# Patient Record
Sex: Female | Born: 1970 | Race: White | Hispanic: No | Marital: Married | Smoking: Never smoker
Health system: Southern US, Community
[De-identification: ages and names within clinical notes are randomized; demographics above are authoritative.]

## PROBLEM LIST (undated history)

## (undated) DIAGNOSIS — I10 Essential (primary) hypertension: Secondary | ICD-10-CM

## (undated) DIAGNOSIS — Z923 Personal history of irradiation: Secondary | ICD-10-CM

---

## 2021-11-18 ENCOUNTER — Ambulatory Visit: Payer: Self-pay | Admitting: General Surgery

## 2021-11-18 DIAGNOSIS — Z170 Estrogen receptor positive status [ER+]: Secondary | ICD-10-CM

## 2021-11-28 ENCOUNTER — Other Ambulatory Visit: Payer: Self-pay | Admitting: General Surgery

## 2021-11-28 DIAGNOSIS — Z170 Estrogen receptor positive status [ER+]: Secondary | ICD-10-CM

## 2021-12-06 ENCOUNTER — Ambulatory Visit
Admission: RE | Admit: 2021-12-06 | Discharge: 2021-12-06 | Disposition: A | Discharge status: Home / Self Care | Payer: BC Managed Care – PPO | Source: Ambulatory Visit | Attending: General Surgery | Admitting: General Surgery

## 2021-12-06 DIAGNOSIS — Z170 Estrogen receptor positive status [ER+]: Secondary | ICD-10-CM

## 2021-12-06 DIAGNOSIS — C50311 Malignant neoplasm of lower-inner quadrant of right female breast: Secondary | ICD-10-CM

## 2021-12-06 MED ORDER — GADOBUTROL 1 MMOL/ML IV SOLN
9.0000 mL | Freq: Once | INTRAVENOUS | Status: AC | PRN
  Administered 2021-12-06: 9 mL via INTRAVENOUS

## 2021-12-18 ENCOUNTER — Telehealth: Payer: Self-pay | Admitting: General Surgery

## 2021-12-18 NOTE — Telephone Encounter (Signed)
I discussed the breast MR findings with the patient and her husband.  The actual mass from the cancer appears to be a similar size to the mammogram, but there is concerning non mass enhancement going from the mass toward the base of the nipple.  I discussed that if this area is all cancer, she will need a mastectomy given that there would not be adequate breast tissue left behind to have any reasonable cosmetic outcome.  We discussed pursuing mastectomy +/- reconstruction vs additional MRI guided biopsy prior to making the decision.    Will order MR guided biopsy of the non mass enhancement area between the nipple and the mass.  I discussed that it is more likely than not to be malignant.  I will also set up appt after the biopsy to discuss finding.

## 2021-12-19 ENCOUNTER — Other Ambulatory Visit: Payer: Self-pay | Admitting: General Surgery

## 2021-12-19 DIAGNOSIS — R928 Other abnormal and inconclusive findings on diagnostic imaging of breast: Secondary | ICD-10-CM

## 2021-12-27 ENCOUNTER — Ambulatory Visit
Admission: RE | Admit: 2021-12-27 | Discharge: 2021-12-27 | Disposition: A | Discharge status: Home / Self Care | Payer: BC Managed Care – PPO | Source: Ambulatory Visit | Attending: General Surgery | Admitting: General Surgery

## 2021-12-27 DIAGNOSIS — R928 Other abnormal and inconclusive findings on diagnostic imaging of breast: Secondary | ICD-10-CM

## 2021-12-30 ENCOUNTER — Other Ambulatory Visit: Payer: Self-pay | Admitting: General Surgery

## 2021-12-30 DIAGNOSIS — Z170 Estrogen receptor positive status [ER+]: Secondary | ICD-10-CM

## 2022-01-03 ENCOUNTER — Other Ambulatory Visit: Payer: Self-pay | Admitting: General Surgery

## 2022-01-03 DIAGNOSIS — Z170 Estrogen receptor positive status [ER+]: Secondary | ICD-10-CM

## 2022-01-07 ENCOUNTER — Telehealth: Payer: Self-pay | Admitting: Hematology and Oncology

## 2022-01-07 NOTE — Telephone Encounter (Signed)
Scheduled appt per 7/7 referral. Pt is aware of appt date and time. Pt is aware to arrive 15 mins prior to appt time and to bring and updated insurance card. Pt is aware of appt location.

## 2022-01-12 LAB — COLOGUARD: COLOGUARD: NEGATIVE

## 2022-01-16 ENCOUNTER — Encounter (HOSPITAL_COMMUNITY): Payer: Self-pay

## 2022-01-16 NOTE — Pre-Procedure Instructions (Signed)
Surgical Instructions    Your procedure is scheduled on Tuesday, July 25th.  Report to [facility] Main Entrance "A" at 05:30 A.M., then check in with the Admitting office.  Call this number if you have problems the morning of surgery:  [phone]   If you have any questions prior to your surgery date call [phone]: Open Monday-Friday 8am-4pm    Remember:  Do not eat after midnight the night before your surgery  You may drink clear liquids until 04:30 AM the morning of your surgery.   Clear liquids allowed are: Water, Non-Citrus Juices (without pulp), Carbonated Beverages, Clear Tea, Black Coffee Only (NO MILK, CREAM OR POWDERED CREAMER of any kind), and Gatorade.   Patient Instructions  The night before surgery:  No food after midnight. ONLY clear liquids after midnight  The day of surgery (if you do NOT have diabetes):  Drink ONE (1) Pre-Surgery Clear Ensure by 04:30 AM the morning of surgery. Drink in one sitting. Do not sip.  This drink was given to you during your hospital  pre-op appointment visit.  Nothing else to drink after completing the  Pre-Surgery Clear Ensure.          If you have questions, please contact your surgeon's office.      Take these medicines the morning of surgery with A SIP OF WATER: NONE   As of today, STOP taking any Aspirin (unless otherwise instructed by your surgeon) Aleve, Naproxen, Ibuprofen, Motrin, Advil, Goody's, BC's, all herbal medications, fish oil, and all vitamins.                     Do NOT Smoke (Tobacco/Vaping) for 24 hours prior to your procedure.  If you use a CPAP at night, you may bring your mask/headgear for your overnight stay.   Contacts, glasses, piercing's, hearing aid's, dentures or partials may not be worn into surgery, please bring cases for these belongings.    For patients admitted to the hospital, discharge time will be determined by your treatment team.   Patients discharged the day of surgery will not  be allowed to drive home, and someone needs to stay with them for 24 hours.  SURGICAL WAITING ROOM VISITATION Patients having surgery or a procedure may have no more than 2 support people in the waiting area - these visitors may rotate.   Children under the age of 12 must have an adult with them who is not the patient. If the patient needs to stay at the hospital during part of their recovery, the visitor guidelines for inpatient rooms apply. Pre-op nurse will coordinate an appropriate time for 1 support person to accompany patient in pre-op.  This support person may not rotate.   Please refer to the [facility] website for the visitor guidelines for Inpatients (after your surgery is over and you are in a regular room).    Special instructions:   [place]- Preparing For Surgery  Before surgery, you can play an important role. Because skin is not sterile, your skin needs to be as free of germs as possible. You can reduce the number of germs on your skin by washing with CHG (chlorahexidine gluconate) Soap before surgery.  CHG is an antiseptic cleaner which kills germs and bonds with the skin to continue killing germs even after washing.    Oral Hygiene is also important to reduce your risk of infection.  Remember - BRUSH YOUR TEETH THE MORNING OF SURGERY WITH YOUR REGULAR TOOTHPASTE  Please do not use if you have an allergy to CHG or antibacterial soaps. If your skin becomes reddened/irritated stop using the CHG.  Do not shave (including legs and underarms) for at least 48 hours prior to first CHG shower. It is OK to shave your face.  Please follow these instructions carefully.   Shower the NIGHT BEFORE SURGERY and the MORNING OF SURGERY  If you chose to wash your hair, wash your hair first as usual with your normal shampoo.  After you shampoo, rinse your hair and body thoroughly to remove the shampoo.  Use CHG Soap as you would any other liquid soap. You can apply CHG directly to the  skin and wash gently with a scrungie or a clean washcloth.   Apply the CHG Soap to your body ONLY FROM THE NECK DOWN.  Do not use on open wounds or open sores. Avoid contact with your eyes, ears, mouth and genitals (private parts). Wash Face and genitals (private parts)  with your normal soap.   Wash thoroughly, paying special attention to the area where your surgery will be performed.  Thoroughly rinse your body with warm water from the neck down.  DO NOT shower/wash with your normal soap after using and rinsing off the CHG Soap.  Pat yourself dry with a CLEAN TOWEL.  Wear CLEAN PAJAMAS to bed the night before surgery  Place CLEAN SHEETS on your bed the night before your surgery  DO NOT SLEEP WITH PETS.   Day of Surgery: Take a shower with CHG soap. Do not wear jewelry or makeup Do not wear lotions, powders, perfumes, or deodorant. Do not shave 48 hours prior to surgery.   Do not bring valuables to the hospital. [facility] is not responsible for any belongings or valuables. Do not wear nail polish, gel polish, artificial nails, or any other type of covering on natural nails (fingers and toes) If you have artificial nails or gel coating that need to be removed by a nail salon, please have this removed prior to surgery. Artificial nails or gel coating may interfere with anesthesia's ability to adequately monitor your vital signs. Wear Clean/Comfortable clothing the morning of surgery Remember to brush your teeth WITH YOUR REGULAR TOOTHPASTE.   Please read over the following fact sheets that you were given.    If you received a COVID test during your pre-op visit  it is requested that you wear a mask when out in public, stay away from anyone that may not be feeling well and notify your surgeon if you develop symptoms. If you have been in contact with anyone that has tested positive in the last 10 days please notify you surgeon.

## 2022-01-17 ENCOUNTER — Other Ambulatory Visit: Payer: Self-pay

## 2022-01-17 ENCOUNTER — Encounter (HOSPITAL_COMMUNITY)
Admission: RE | Admit: 2022-01-17 | Discharge: 2022-01-17 | Disposition: A | Discharge status: Home / Self Care | Payer: BC Managed Care – PPO | Source: Ambulatory Visit | Attending: General Surgery | Admitting: General Surgery

## 2022-01-17 ENCOUNTER — Encounter (HOSPITAL_COMMUNITY): Payer: Self-pay | Admitting: *Deleted

## 2022-01-17 VITALS — BP 131/79 | HR 101 | Temp 98.6°F | Resp 17 | Ht 66.0 in | Wt 208.0 lb

## 2022-01-17 DIAGNOSIS — Z01818 Encounter for other preprocedural examination: Secondary | ICD-10-CM | POA: Diagnosis present

## 2022-01-17 DIAGNOSIS — I10 Essential (primary) hypertension: Secondary | ICD-10-CM

## 2022-01-17 DIAGNOSIS — C50311 Malignant neoplasm of lower-inner quadrant of right female breast: Secondary | ICD-10-CM

## 2022-01-17 DIAGNOSIS — Z170 Estrogen receptor positive status [ER+]: Secondary | ICD-10-CM | POA: Diagnosis not present

## 2022-01-17 HISTORY — DX: Essential (primary) hypertension: I10

## 2022-01-17 LAB — COMPREHENSIVE METABOLIC PANEL
ALT: 42 U/L (ref 0–44)
AST: 27 U/L (ref 15–41)
Albumin: 3.8 g/dL (ref 3.5–5.0)
Alkaline Phosphatase: 58 U/L (ref 38–126)
Anion gap: 8 (ref 5–15)
BUN: 13 mg/dL (ref 6–20)
CO2: 26 mmol/L (ref 22–32)
Calcium: 9.4 mg/dL (ref 8.9–10.3)
Chloride: 106 mmol/L (ref 98–111)
Creatinine, Ser: 0.82 mg/dL (ref 0.44–1.00)
GFR, Estimated: >60 mL/min (ref >60)
Glucose, Bld: 124 mg/dL — ABNORMAL HIGH (ref 70–99)
Potassium: 3.8 mmol/L (ref 3.5–5.1)
Sodium: 140 mmol/L (ref 135–145)
Total Bilirubin: 1.1 mg/dL (ref 0.3–1.2)
Total Protein: 7.2 g/dL (ref 6.5–8.1)

## 2022-01-17 LAB — CBC WITH DIFFERENTIAL/PLATELET
Abs Immature Granulocytes: 0.02 10*3/uL (ref 0.00–0.07)
Basophils Absolute: 0.1 10*3/uL (ref 0.0–0.1)
Basophils Relative: 1 %
Eosinophils Absolute: 0.1 10*3/uL (ref 0.0–0.5)
Eosinophils Relative: 2 %
HCT: 44.8 % (ref 36.0–46.0)
Hemoglobin: 15.3 g/dL — ABNORMAL HIGH (ref 12.0–15.0)
Immature Granulocytes: 0 %
Lymphocytes Relative: 22 %
Lymphs Abs: 1.9 10*3/uL (ref 0.7–4.0)
MCH: 30.3 pg (ref 26.0–34.0)
MCHC: 34.2 g/dL (ref 30.0–36.0)
MCV: 88.7 fL (ref 80.0–100.0)
Monocytes Absolute: 0.5 10*3/uL (ref 0.1–1.0)
Monocytes Relative: 6 %
Neutro Abs: 6 10*3/uL (ref 1.7–7.7)
Neutrophils Relative %: 69 %
Platelets: 330 10*3/uL (ref 150–400)
RBC: 5.05 MIL/uL (ref 3.87–5.11)
RDW: 12.4 % (ref 11.5–15.5)
WBC: 8.6 10*3/uL (ref 4.0–10.5)
nRBC: 0 % (ref 0.0–0.2)

## 2022-01-17 NOTE — Progress Notes (Signed)
Location of Breast Cancer:  Malignant neoplasm of lower-inner quadrant of right breast of female, estrogen receptor positive  Histology per Pathology Report:  (Definitive pathology pending upcoming surgery) 11/05/2021   Receptor Status: ER(90%), PR (90%), Her2-neu (Negative), Ki-67(10%)  Did patient present with symptoms (if so, please note symptoms) or was this found on screening mammography?: (Dr. Marlowe Aschoff office note) Patient "had a screening detected right breast mass. Diagnostic imaging was then performed. That demonstrated a mass at 530 on the right breast. By ultrasound this appeared to be 1.9 cm in greatest dimension and on diagnostic mammogram it was closer to 2.6 cm"  Past/Anticipated interventions by surgeon, if any:  01/21/2022 --Dr. Stark Klein Scheduled for: RIGHT BREAST SEED LOCALIZED LUMPECTOMY AND SENTINEL LYMPH NODE BIOPSY  12/30/2021 --Dr. Stark Klein (office visit) Patient had a concordant biopsy for the non-mass enhancement going from the mass toward the nipple.  Because of this, it appears that her only cancer is the index mass.  This is amenable to a lumpectomy.  I will plan to do a seed localized lumpectomy and sentinel lymph node biopsy. Because of the recent move, she and her husband would like to come to [facility] for radiation oncology and medical oncology.  She plans to stay with her sister in [place] after surgery for a week or so. The surgical procedure was described to the patient.  I discussed the incision type and location and that we would need radiology involved on with a wire or seed marker and/or sentinel node.  The risks and benefits of the procedure were described to the patient and she wishes to proceed.    Past/Anticipated interventions by medical oncology, if any:  Scheduled for consultation with Dr. Benay Pike on 01/27/2022  Lymphedema issues, if any:  ***    Pain issues, if any:  ***   SAFETY ISSUES: Prior radiation?  *** Pacemaker/ICD? *** Possible current pregnancy?***No--postmenopausal  Is the patient on methotrexate? ***  Current Complaints / other details:  ***

## 2022-01-17 NOTE — Progress Notes (Signed)
PCP - Francis Gaines, FNP in [place] - Denies  PPM/ICD - Denies Device Orders -  Rep Notified -   Chest x-ray - NI EKG - 01/17/22 Stress Test - Denies ECHO - Denies Cardiac Cath -Denies  Sleep Study - Denies  DM - Denies   Blood Thinner Instructions:Denies Aspirin Instructions:Denies  ERAS Protcol -Yes PRE-SURGERY Ensure given   COVID TEST- NI   Anesthesia review: No  Patient denies shortness of breath, fever, cough and chest pain at PAT appointment   All instructions explained to the patient, with a verbal understanding of the material. Patient agrees to go over the instructions while at home for a better understanding.  The opportunity to ask questions was provided.

## 2022-01-19 NOTE — Progress Notes (Signed)
Radiation Oncology         [phone] ________________________________  Initial Outpatient Consultation  Name: Barbara [name] MRN: [id number]  Date: 01/20/2022  DOB: [birth year]  AC:ZYSAYTKZ, Toni Arthurs, FNP  Stark Klein, MD   REFERRING PHYSICIAN: Stark Klein, MD  DIAGNOSIS:    ICD-10-CM   1. Malignant neoplasm of lower-inner quadrant of right breast of female, estrogen receptor positive (Barbara [name])  C50.311    Z17.0       Right Breast LIQ Invasive Lobular Carcinoma, ER+ / PR+ / Her2-, Grade 2   Cancer Staging  Breast cancer of lower-inner quadrant of right female breast [facility]) Staging form: Breast, AJCC 8th Edition - Clinical stage from 01/20/2022: Stage IB (cT2, cN0, cM0, G2, ER+, PR+, HER2-) - Signed by Eppie Gibson, MD on 01/20/2022   CHIEF COMPLAINT: Here to discuss management of right breast cancer  HISTORY OF PRESENT ILLNESS::Barbara [name] is a 51 y.o. female who presented with a right breast abnormality on the following imaging: bilateral screening mammogram on the date of 10/30/21.  No symptoms, if any, were reported at that time.   Ultrasound of the right breast on 10/31/21 further revealed an irregular, hypoechoic, solid mass in the 5:30 o'clock position of the right breast, measuring 1.9 cm. No abnormal lymph nodes were appreciated in the right axilla.     Biopsy of the right breast mass on date of 11/05/21 showed grade 2 invasive lobular carcinoma.  ER status: 90% positive and PR status 90% positive, both with strong staining intensity; Her2 status negative; Proliferation marker Ki67 at 10%. No lymph nodes were examined.   Accordingly, the patient was referred to Dr. Barry Dienes on 11/18/21 to discuss surgical treatment options. Following discussion of the risks and benefits, the patient agreed to proceed with breast conserving surgery and SLN biopsies. She is scheduled for surgery on 01/21/22.   Pertinent imaging thus far includes a bilateral breast MRI on 12/06/21 which showed the  biopsy-proven carcinoma within the lower right breast, measuring 2.6 cm in the greatest dimension, with associated tenting of the underlying pectoralis muscle, but no evidence of enhancement/extension into the underlying pectoralis muscle. MRI also showed linear contiguous non-mass enhancement extending from the biopsy-proven carcinoma to near the base of the right nipple, measuring an additional 3.1 cm extent. Associated retraction of the right nipple and lower anterior periareolar right breast were also appreciated. Involvement of the right nipple itself thus can not be entirely excluded. MRI otherwise showed no evidence of additional multifocal or multicentric disease within the right breast, malignancy within the left breast, or evidence of metastatic lymphadenopathy.  Biopsy of the non-mass enhancement seen on MRI (going from the right breast mass towards the nipple) on 12/27/21 showed no evidence of malignancy, with usual ductal hyperplasia, fibrocystic changes, apocrine metaplasia, and calcifications.   The patient recently and temporarily moved to this region in transition from [facility].  She ultimately has plans to move to [facility].  She wishes to receive all her oncologic care here.  She is not currently working.  She is here with her husband who appears very supportive.  She denies prior radiation.  She denies smoking.  Surgical breast conserving therapy planned for tomorrow.  She states that she is postmenopausal.  PREVIOUS RADIATION THERAPY: No  PAST MEDICAL HISTORY:  has a past medical history of Hypertension.    PAST SURGICAL HISTORY:History reviewed. No pertinent surgical history.  FAMILY HISTORY: family history includes Lung cancer in her father; Multiple myeloma in her mother.  SOCIAL HISTORY:  reports that she has never smoked. She has never used smokeless tobacco. She reports that she does not drink alcohol and does not use drugs.  ALLERGIES:  Patient has no known allergies.  MEDICATIONS:  Current Outpatient Medications  Medication Sig Dispense Refill   losartan-hydrochlorothiazide (HYZAAR) 100-12.5 MG tablet Take 1 tablet by mouth daily.     Omega-3 Fatty Acids (FISH OIL) 1000 MG CAPS Take 2 capsules by mouth daily.     No current facility-administered medications for this encounter.    REVIEW OF SYSTEMS: As above in HPI.   PHYSICAL EXAM:  height is _0  (1.676 m) and weight is 210 lb 9.6 oz (95.5 kg). Her blood pressure is 133/82 and her pulse is 102 (abnormal). Her respiration is 16 and oxygen saturation is 98%.   General: Alert and oriented, in no acute distress HEENT: Head is normocephalic. Extraocular movements are intact.   Heart: Regular in rate and rhythm with no murmurs, rubs, or gallops. Chest: Clear to auscultation bilaterally, with no rhonchi, wheezes, or rales. Extremities: No cyanosis or edema. Skin: No concerning lesions. Musculoskeletal: symmetric strength and muscle tone throughout. Neurologic: Cranial nerves II through XII are grossly intact. No obvious focalities. Speech is fluent. Coordination is intact. Psychiatric: Judgment and insight are intact. Affect is appropriate. Breasts: Lower inner quadrant of right breast demonstrates some retraction and diffuse firmness.  There is partial right nipple retraction as well.. No other palpable masses appreciated in the breasts or axillae bilaterally.    ECOG = 0  0 - Asymptomatic (Fully active, able to carry on all predisease activities without restriction)  1 - Symptomatic but completely ambulatory (Restricted in physically strenuous activity but ambulatory and able to carry out work of a light or sedentary nature. For example, light housework, office work)  2 - Symptomatic, <50% in bed during the day (Ambulatory and capable of all self care but unable to carry out any work activities. Up and about more than 50% of waking hours)  3 - Symptomatic, >50% in  bed, but not bedbound (Capable of only limited self-care, confined to bed or chair 50% or more of waking hours)  4 - Bedbound (Completely disabled. Cannot carry on any self-care. Totally confined to bed or chair)  5 - Death   Eustace Pen MM, Creech RH, Tormey DC, et al. [phone]). "Toxicity and response criteria of the [facility]". [place] Oncol. 5 (6): 649-55   LABORATORY DATA:  Lab Results  Component Value Date   WBC 8.6 01/17/2022   HGB 15.3 (H) 01/17/2022   HCT 44.8 01/17/2022   MCV 88.7 01/17/2022   PLT 330 01/17/2022   CMP     Component Value Date/Time   NA 140 01/17/2022 0850   K 3.8 01/17/2022 0850   CL 106 01/17/2022 0850   CO2 26 01/17/2022 0850   GLUCOSE 124 (H) 01/17/2022 0850   BUN 13 01/17/2022 0850   CREATININE 0.82 01/17/2022 0850   CALCIUM 9.4 01/17/2022 0850   PROT 7.2 01/17/2022 0850   ALBUMIN 3.8 01/17/2022 0850   AST 27 01/17/2022 0850   ALT 42 01/17/2022 0850   ALKPHOS 58 01/17/2022 0850   BILITOT 1.1 01/17/2022 0850   GFRNONAA >60 01/17/2022 0850         RADIOGRAPHY: MR RT BREAST BX W LOC DEV 1ST LESION IMAGE BX SPEC MR GUIDE  Addendum Date: 01/01/2022   ADDENDUM REPORT: 01/01/2022 12:01 ADDENDUM: Pathology revealed Breast, RIGHT, needle core biopsy- (  dumbbell clip)- USUAL DUCTAL HYPERPLASIA, FIBROCYSTIC CHANGES WITH APOCRINE METAPLASIA AND CALCIFICATIONS- LOBULAR NEOPLASIA (ATYPICAL LOBULAR HYPERPLASIA)- NO MALIGNANCY IDENTIFIED. This was found to be concordant by Dr. Lovey Newcomer. This should be excised the time of lumpectomy. Pathology results were discussed with the patient by telephone. The patient reported doing well after the biopsy with tenderness at the site. Post biopsy instructions and care were reviewed and questions were answered. The patient was encouraged to call [facility] for any additional concerns. The patient has a recent diagnosis of right breast cancer and should follow her outlined  treatment plan. The findings were discussed with Dr. Barry Dienes. She will perform a wide excision along the anterior margin of the known cancer towards the nipple to include the linear enhancement on prior MRI. Pathology results reported by Stacie Acres RN on 01/01/2022. Electronically Signed   By: Lovey Newcomer M.D.   On: 01/01/2022 12:01   Result Date: 01/01/2022 CLINICAL DATA:  MRI guided biopsy of linear enhancement extending anterior to the known malignancy. EXAM: MRI GUIDED CORE NEEDLE BIOPSY OF THE RIGHT BREAST TECHNIQUE: Multiplanar, multisequence MR imaging of the right breast was performed both before and after administration of intravenous contrast. CONTRAST:  9 mL of Gadavist COMPARISON:  None Available. FINDINGS: I met with the patient, and we discussed the procedure of MRI guided biopsy, including risks, benefits, and alternatives. Specifically, we discussed the risks of infection, bleeding, tissue injury, clip migration, and inadequate sampling. Informed, written consent was given. The usual time out protocol was performed immediately prior to the procedure. Using sterile technique, 1% Lidocaine, MRI guidance, and a 9 gauge vacuum assisted device, biopsy was performed of the linear enhancement extending anterior to the known malignancy using a medial approach. At the conclusion of the procedure, a tissue marker clip was deployed into the biopsy cavity. Follow-up 2-view mammogram was performed and dictated separately. IMPRESSION: MRI guided biopsy of the linear enhancement extending anterior to the known malignancy. No apparent complications. Electronically Signed: By: Dorise Bullion III M.D. On: 12/27/2021 09:51  MM CLIP PLACEMENT RIGHT  Result Date: 12/27/2021 CLINICAL DATA:  Evaluate biopsy marker after MRI guided biopsy EXAM: 3D DIAGNOSTIC RIGHT MAMMOGRAM POST MRI BIOPSY COMPARISON:  Previous exam(s). FINDINGS: 3D Mammographic images were obtained following MRI guided biopsy of non mass enhancement in  the right breast. The biopsy marking clip is in expected position at the site of biopsy. IMPRESSION: Appropriate positioning of the dumbbell shaped biopsy marking clip at the site of biopsy in the location of the biopsied non mass enhancement. Final Assessment: Post Procedure Mammograms for Marker Placement Electronically Signed   By: Dorise Bullion III M.D.   On: 12/27/2021 10:23     IMPRESSION/PLAN: Right breast cancer, ER positive, with plans for lumpectomy and sentinel lymph node biopsy  It was a pleasure meeting the patient today. We discussed the risks, benefits, and side effects of radiotherapy. I recommend radiotherapy to the right breast to reduce her risk of locoregional recurrence by 2/3.  We discussed that radiation would take approximately 4-6 weeks to complete and that I would give the patient a few weeks to heal following surgery before starting treatment planning.  If chemotherapy were to be given, this would precede radiotherapy. We spoke about acute effects including skin irritation and fatigue as well as much less common late effects including internal organ injury or irritation. We spoke about the latest technology that is used to minimize the risk of late effects for  patients undergoing radiotherapy to the breast or chest wall. No guarantees of treatment were given. The patient is enthusiastic about proceeding with treatment. I look forward to participating in the patient's care.  I will await her referral back to me for postoperative follow-up and eventual CT simulation/treatment planning.  On date of service, in total, I spent 40 minutes on this encounter. Patient was seen in person.   __________________________________________   Eppie Gibson, MD  This document serves as a record of services personally performed by Eppie Gibson, MD. It was created on her behalf by Roney Mans, a trained medical scribe. The creation of this record is based on the scribe's personal observations  and the provider's statements to them. This document has been checked and approved by the attending provider.

## 2022-01-20 ENCOUNTER — Encounter: Payer: Self-pay | Admitting: Radiation Oncology

## 2022-01-20 ENCOUNTER — Ambulatory Visit
Admission: RE | Admit: 2022-01-20 | Discharge: 2022-01-20 | Disposition: A | Discharge status: Home / Self Care | Payer: BC Managed Care – PPO | Source: Ambulatory Visit | Attending: Radiation Oncology | Admitting: Radiation Oncology

## 2022-01-20 ENCOUNTER — Ambulatory Visit
Admission: RE | Admit: 2022-01-20 | Discharge: 2022-01-20 | Disposition: A | Discharge status: Home / Self Care | Payer: BC Managed Care – PPO | Source: Ambulatory Visit | Attending: General Surgery | Admitting: General Surgery

## 2022-01-20 ENCOUNTER — Other Ambulatory Visit: Payer: Self-pay

## 2022-01-20 DIAGNOSIS — C50311 Malignant neoplasm of lower-inner quadrant of right female breast: Secondary | ICD-10-CM

## 2022-01-20 NOTE — Anesthesia Preprocedure Evaluation (Addendum)
Anesthesia Evaluation  Patient identified by MRN, date of birth, ID band Patient awake    Reviewed: Allergy & Precautions, NPO status , Patient's Chart, lab work & pertinent test results  History of Anesthesia Complications Negative for: history of anesthetic complications  Airway Mallampati: II  TM Distance: >3 FB Neck ROM: Full    Dental  (+) Dental Advisory Given   Pulmonary neg pulmonary ROS,    breath sounds clear to auscultation       Cardiovascular hypertension, Pt. on medications (-) angina Rhythm:Regular Rate:Normal     Neuro/Psych negative neurological ROS     GI/Hepatic negative GI ROS, Neg liver ROS,   Endo/Other  obese  Renal/GU negative Renal ROS     Musculoskeletal   Abdominal (+) + obese,   Peds  Hematology negative hematology ROS (+)   Anesthesia Other Findings   Reproductive/Obstetrics                            Anesthesia Physical Anesthesia Plan  ASA: 2  Anesthesia Plan: General   Post-op Pain Management: Regional block* and Tylenol PO (pre-op)*   Induction: Intravenous  PONV Risk Score and Plan: 3 and Ondansetron, Dexamethasone and Scopolamine patch - Pre-op  Airway Management Planned: LMA  Additional Equipment: None  Intra-op Plan:   Post-operative Plan:   Informed Consent: I have reviewed the patients History and Physical, chart, labs and discussed the procedure including the risks, benefits and alternatives for the proposed anesthesia with the patient or authorized representative who has indicated his/her understanding and acceptance.     Dental advisory given  Plan Discussed with: CRNA and Surgeon  Anesthesia Plan Comments: (Plan routine monitors, GA with pectoralis block for post op analgesia)       Anesthesia Quick Evaluation

## 2022-01-20 NOTE — H&P (Signed)
PROVIDER:  Georgianne Fick, MD   MRN: [id number] DOB: 07/29/1970 DATE OF ENCOUNTER: 12/30/2021 Subjective     Chief Complaint: Follow-up (Breast mri results)       History of Present Illness: Barbara [name] is a 51 y.o. female who is seen today for breast cancer follow up Initial history:    Patient presented with a new right breast cancer May 2023.  She had a screening detected right breast mass. Diagnostic imaging was then performed.  That demonstrated a mass at 530 on the right breast.  By ultrasound this appeared to be 1.9 cm in greatest dimension and on diagnostic mammogram it was closer to 2.6 cm.  A core needle biopsy was performed showing intermediate grade invasive lobular carcinoma.  This was ER and PR strongly positive at 90% for both, HER2 negative.  Proliferative rate was 10%.   She has no family history of adenocarcinomas of which she is aware.  Her mother did have multiple myeloma and she has some relatives who have had lung cancer.  She has not ever had surgery before.  She typically works in the [employer] but is currently taking time between jobs with this new diagnosis of cancer.   Interval history:  Pt underwent breast MRI and a subsequent MR guided biopsy.  The MR showed enhancement anterior to the mass going all the way to the nipple.  The path on the MR guided biopsy was benign and concordant.     She moved to [place] since I saw her last.     MR bilat 12/06/2021   IMPRESSION: 1. Biopsy-proven carcinoma within the lower RIGHT breast, labeled 5:30 o'clock axis on outside ultrasound, measuring 2.6 cm greatest dimension. Associated tenting of the underlying pectoralis muscle, but no evidence of enhancement/extension into the underlying pectoralis muscle. 2. Linear contiguous non-mass enhancement extending from the biopsy-proven carcinoma to near the base of the RIGHT nipple, measuring an additional 3.1 cm extent, increasing the overall AP measurement to  5.3 cm, with associated retraction of the RIGHT nipple and lower anterior periareolar RIGHT breast. Can not exclude involvement of the RIGHT nipple itself. If breast conservation surgery, recommend adequate excision from the mass to the RIGHT nipple. 3. No evidence of additional multifocal or multicentric disease within the RIGHT breast. 4. No evidence of contralateral malignancy within the LEFT breast. 5. No evidence of metastatic lymphadenopathy.   RECOMMENDATION: 1. If breast conservation surgery for the RIGHT breast, recommend adequate excision from the biopsy-proven carcinoma in the lower RIGHT breast to the RIGHT nipple (as detailed above). 2. Otherwise, per treatment plan for patient's biopsy-proven RIGHT breast cancer.   BI-RADS CATEGORY  6: Known biopsy-proven malignancy.   Pathology 12/27/2021 Breast, right, needle core biopsy - USUAL DUCTAL HYPERPLASIA, FIBROCYSTIC CHANGES WITH APOCRINE METAPLASIA AND CALCIFICATIONS - LOBULAR NEOPLASIA (ATYPICAL LOBULAR HYPERPLASIA) - NO MALIGNANCY IDENTIFIED   Review of Systems: A complete review of systems was obtained from the patient.  I have reviewed this information and discussed as appropriate with the patient.  See HPI as well for other ROS.   Review of Systems  All other systems reviewed and are negative.      Medical History: Past Medical History      Past Medical History:  Diagnosis Date   Hypertension             Patient Active Problem List  Diagnosis   Malignant neoplasm of lower-inner quadrant of right breast of female, estrogen receptor positive (CMS-HCC)  Past Surgical History  History reviewed. No pertinent surgical history.      Allergies  No Known Allergies           Current Outpatient Medications on File Prior to Visit  Medication Sig Dispense Refill   FISH OIL 1,000 mg (120 mg-180 mg) Cap TAKE 2 CAPSULES BY MOUTH ONCE DAILY       losartan-hydroCHLOROthiazide (HYZAAR) 100-12.5 mg tablet Take  1 tablet by mouth once daily        No current facility-administered medications on file prior to visit.      Family History       Family History  Problem Relation Age of Onset   Hyperlipidemia (Elevated cholesterol) Mother     High blood pressure (Hypertension) Mother          Social History       Tobacco Use  Smoking Status Never  Smokeless Tobacco Never      Social History  Social History        Socioeconomic History   Marital status: Unknown  Tobacco Use   Smoking status: Never   Smokeless tobacco: Never  Substance and Sexual Activity   Alcohol use: Never   Drug use: Never        Objective:      There were no vitals filed for this visit.  There is no height or weight on file to calculate BMI.   Head:   Normocephalic and atraumatic.  Eyes:    Conjunctivae are normal. Pupils are equal, round, and reactive to light. No scleral icterus.  Neck:   Normal range of motion. Neck supple. No tracheal deviation present. No thyromegaly present.  Resp:   No respiratory distress, normal effort. Breast: no palpable mass.  No LAD. Faint bruising right breast Abd:      Abdomen is soft, non distended and non tender. No masses are palpable.  There is no rebound and no guarding.  Neurological: Alert and oriented to person, place, and time. Coordination normal.  Skin:    Skin is warm and dry. No rash noted. No diaphoretic. No erythema. No pallor.  Psychiatric: Normal mood and affect. Normal behavior. Judgment and thought content normal.      Labs, Imaging and Diagnostic Testing:   See above.     Assessment and Plan:     Diagnoses and all orders for this visit:   Malignant neoplasm of lower-inner quadrant of right breast of female, estrogen receptor positive (CMS-HCC) -     Ambulatory Referral to Radiation Oncology -     Ambulatory Referral to Oncology-Medical     Patient had a concordant biopsy for the non-mass enhancement going from the mass toward the nipple.   Because of this, it appears that her only cancer is the index mass.  This is amenable to a lumpectomy.  I will plan to do a seed localized lumpectomy and sentinel lymph node biopsy.   Because of the recent move, she and her husband would like to come to [facility] for radiation oncology and medical oncology.  She plans to stay with her sister in [place] after surgery for a week or so.   The surgical procedure was described to the patient.  I discussed the incision type and location and that we would need radiology involved on with a wire or seed marker and/or sentinel node.       The risks and benefits of the procedure were described to the patient and she wishes to proceed.  We discussed the risks bleeding, infection, damage to other structures, need for further procedures/surgeries.  We discussed the risk of seroma.  The patient was advised if the area in the breast in cancer, we may need to go back to surgery for additional tissue to obtain negative margins or for a lymph node biopsy. The patient was advised that these are the most common complications, but that others can occur as well.  They were advised against taking aspirin or other anti-inflammatory agents/blood thinners the week before surgery.       No follow-ups on file. Georgianne Fick, MD

## 2022-01-21 ENCOUNTER — Ambulatory Visit (HOSPITAL_COMMUNITY): Payer: BC Managed Care – PPO | Admitting: Anesthesiology

## 2022-01-21 ENCOUNTER — Ambulatory Visit (HOSPITAL_COMMUNITY)
Admission: RE | Admit: 2022-01-21 | Discharge: 2022-01-21 | Disposition: A | Discharge status: Home / Self Care | Payer: BC Managed Care – PPO | Attending: General Surgery | Admitting: General Surgery

## 2022-01-21 ENCOUNTER — Encounter (HOSPITAL_COMMUNITY): Payer: Self-pay | Admitting: General Surgery

## 2022-01-21 ENCOUNTER — Ambulatory Visit (HOSPITAL_COMMUNITY): Payer: BC Managed Care – PPO | Admitting: Emergency Medicine

## 2022-01-21 ENCOUNTER — Other Ambulatory Visit: Payer: Self-pay

## 2022-01-21 ENCOUNTER — Encounter (HOSPITAL_COMMUNITY)
Admission: RE | Disposition: A | Discharge status: Home / Self Care | Payer: Self-pay | Source: Home / Self Care | Attending: General Surgery

## 2022-01-21 ENCOUNTER — Ambulatory Visit
Admission: RE | Admit: 2022-01-21 | Discharge: 2022-01-21 | Disposition: A | Discharge status: Home / Self Care | Payer: BC Managed Care – PPO | Source: Ambulatory Visit | Attending: General Surgery | Admitting: General Surgery

## 2022-01-21 DIAGNOSIS — Z801 Family history of malignant neoplasm of trachea, bronchus and lung: Secondary | ICD-10-CM | POA: Insufficient documentation

## 2022-01-21 DIAGNOSIS — Z6833 Body mass index (BMI) 33.0-33.9, adult: Secondary | ICD-10-CM | POA: Diagnosis not present

## 2022-01-21 DIAGNOSIS — E669 Obesity, unspecified: Secondary | ICD-10-CM | POA: Diagnosis not present

## 2022-01-21 DIAGNOSIS — C50311 Malignant neoplasm of lower-inner quadrant of right female breast: Secondary | ICD-10-CM | POA: Diagnosis present

## 2022-01-21 DIAGNOSIS — I10 Essential (primary) hypertension: Secondary | ICD-10-CM | POA: Insufficient documentation

## 2022-01-21 DIAGNOSIS — Z807 Family history of other malignant neoplasms of lymphoid, hematopoietic and related tissues: Secondary | ICD-10-CM | POA: Diagnosis not present

## 2022-01-21 DIAGNOSIS — Z170 Estrogen receptor positive status [ER+]: Secondary | ICD-10-CM

## 2022-01-21 HISTORY — PX: BREAST LUMPECTOMY WITH RADIOACTIVE SEED AND SENTINEL LYMPH NODE BIOPSY: SHX6550

## 2022-01-21 SURGERY — BREAST LUMPECTOMY WITH RADIOACTIVE SEED AND SENTINEL LYMPH NODE BIOPSY
Anesthesia: General | Site: Breast | Laterality: Right

## 2022-01-21 MED ORDER — SCOPOLAMINE 1 MG/3DAYS TD PT72
MEDICATED_PATCH | TRANSDERMAL | Status: AC
  Administered 2022-01-21: 1.5 mg via TRANSDERMAL
  Filled 2022-01-21: qty 1

## 2022-01-21 MED ORDER — CHLORHEXIDINE GLUCONATE CLOTH 2 % EX PADS: 6.0000 | MEDICATED_PAD | Freq: Once | CUTANEOUS | Status: DC

## 2022-01-21 MED ORDER — ONDANSETRON HCL 4 MG/2ML IJ SOLN
INTRAMUSCULAR | Status: DC | PRN
  Administered 2022-01-21: 4 mg via INTRAVENOUS

## 2022-01-21 MED ORDER — PROPOFOL 10 MG/ML IV BOLUS
INTRAVENOUS | Status: AC
  Filled 2022-01-21: qty 20

## 2022-01-21 MED ORDER — MIDAZOLAM HCL 2 MG/2ML IJ SOLN
INTRAMUSCULAR | Status: AC
  Filled 2022-01-21: qty 2

## 2022-01-21 MED ORDER — PHENYLEPHRINE HCL-NACL 20-0.9 MG/250ML-% IV SOLN
INTRAVENOUS | Status: DC | PRN
  Administered 2022-01-21: 20 ug/min via INTRAVENOUS
  Administered 2022-01-21: 40 ug/min via INTRAVENOUS

## 2022-01-21 MED ORDER — LIDOCAINE 2% (20 MG/ML) 5 ML SYRINGE
INTRAMUSCULAR | Status: DC | PRN
  Administered 2022-01-21: 40 mg via INTRAVENOUS

## 2022-01-21 MED ORDER — SCOPOLAMINE 1 MG/3DAYS TD PT72: 1.0000 | MEDICATED_PATCH | TRANSDERMAL | Status: DC

## 2022-01-21 MED ORDER — ACETAMINOPHEN 500 MG PO TABS: 1000.0000 mg | ORAL_TABLET | ORAL | Status: AC

## 2022-01-21 MED ORDER — OXYCODONE HCL 5 MG/5ML PO SOLN: 5.0000 mg | Freq: Once | ORAL | Status: AC | PRN

## 2022-01-21 MED ORDER — AMISULPRIDE (ANTIEMETIC) 5 MG/2ML IV SOLN
5.0000 mg | Freq: Once | INTRAVENOUS | Status: AC
  Administered 2022-01-21: 5 mg via INTRAVENOUS

## 2022-01-21 MED ORDER — PROPOFOL 10 MG/ML IV BOLUS
INTRAVENOUS | Status: DC | PRN
  Administered 2022-01-21: 20 mg via INTRAVENOUS
  Administered 2022-01-21: 200 mg via INTRAVENOUS

## 2022-01-21 MED ORDER — CEFAZOLIN SODIUM-DEXTROSE 2-4 GM/100ML-% IV SOLN
INTRAVENOUS | Status: AC
  Filled 2022-01-21: qty 100

## 2022-01-21 MED ORDER — BUPIVACAINE-EPINEPHRINE (PF) 0.5% -1:200000 IJ SOLN
INTRAMUSCULAR | Status: DC | PRN
  Administered 2022-01-21: 30 mL

## 2022-01-21 MED ORDER — ACETAMINOPHEN 500 MG PO TABS
ORAL_TABLET | ORAL | Status: AC
  Administered 2022-01-21: 1000 mg via ORAL
  Filled 2022-01-21: qty 2

## 2022-01-21 MED ORDER — MIDAZOLAM HCL 2 MG/2ML IJ SOLN
INTRAMUSCULAR | Status: DC | PRN
  Administered 2022-01-21 (×2): 1 mg via INTRAVENOUS

## 2022-01-21 MED ORDER — AMISULPRIDE (ANTIEMETIC) 5 MG/2ML IV SOLN
INTRAVENOUS | Status: AC
  Filled 2022-01-21: qty 2

## 2022-01-21 MED ORDER — DEXAMETHASONE SODIUM PHOSPHATE 10 MG/ML IJ SOLN
INTRAMUSCULAR | Status: DC | PRN
  Administered 2022-01-21: 10 mg via INTRAVENOUS

## 2022-01-21 MED ORDER — 0.9 % SODIUM CHLORIDE (POUR BTL) OPTIME
TOPICAL | Status: DC | PRN
  Administered 2022-01-21: 1000 mL

## 2022-01-21 MED ORDER — MIDAZOLAM HCL 2 MG/2ML IJ SOLN: 0.5000 mg | Freq: Once | INTRAMUSCULAR | Status: DC | PRN

## 2022-01-21 MED ORDER — HYDROMORPHONE HCL 1 MG/ML IJ SOLN: 0.2500 mg | INTRAMUSCULAR | Status: DC | PRN

## 2022-01-21 MED ORDER — CEFAZOLIN SODIUM-DEXTROSE 2-4 GM/100ML-% IV SOLN
2.0000 g | INTRAVENOUS | Status: AC
  Administered 2022-01-21: 2 g via INTRAVENOUS

## 2022-01-21 MED ORDER — LACTATED RINGERS IV SOLN: INTRAVENOUS | Status: DC

## 2022-01-21 MED ORDER — FENTANYL CITRATE (PF) 250 MCG/5ML IJ SOLN
INTRAMUSCULAR | Status: DC | PRN
  Administered 2022-01-21 (×3): 25 ug via INTRAVENOUS
  Administered 2022-01-21: 50 ug via INTRAVENOUS
  Administered 2022-01-21 (×3): 25 ug via INTRAVENOUS

## 2022-01-21 MED ORDER — ENSURE PRE-SURGERY PO LIQD: 296.0000 mL | Freq: Once | ORAL | Status: DC

## 2022-01-21 MED ORDER — ACETAMINOPHEN 500 MG PO TABS: 1000.0000 mg | ORAL_TABLET | Freq: Once | ORAL | Status: AC

## 2022-01-21 MED ORDER — OXYCODONE HCL 5 MG PO TABS: 5.0000 mg | ORAL_TABLET | Freq: Four times a day (QID) | ORAL | 0 refills | Status: DC | PRN

## 2022-01-21 MED ORDER — MEPERIDINE HCL 25 MG/ML IJ SOLN: 6.2500 mg | INTRAMUSCULAR | Status: DC | PRN

## 2022-01-21 MED ORDER — CHLORHEXIDINE GLUCONATE 0.12 % MT SOLN: 15.0000 mL | Freq: Once | OROMUCOSAL | Status: AC

## 2022-01-21 MED ORDER — CHLORHEXIDINE GLUCONATE 0.12 % MT SOLN
OROMUCOSAL | Status: AC
  Administered 2022-01-21: 15 mL via OROMUCOSAL
  Filled 2022-01-21: qty 15

## 2022-01-21 MED ORDER — OXYCODONE HCL 5 MG PO TABS
ORAL_TABLET | ORAL | Status: AC
  Filled 2022-01-21: qty 1

## 2022-01-21 MED ORDER — LIDOCAINE HCL (PF) 1 % IJ SOLN
INTRAMUSCULAR | Status: AC
  Filled 2022-01-21: qty 30

## 2022-01-21 MED ORDER — FENTANYL CITRATE (PF) 250 MCG/5ML IJ SOLN
INTRAMUSCULAR | Status: AC
  Filled 2022-01-21: qty 5

## 2022-01-21 MED ORDER — ORAL CARE MOUTH RINSE: 15.0000 mL | Freq: Once | OROMUCOSAL | Status: AC

## 2022-01-21 MED ORDER — PROMETHAZINE HCL 25 MG/ML IJ SOLN: 6.2500 mg | INTRAMUSCULAR | Status: DC | PRN

## 2022-01-21 MED ORDER — MAGTRACE LYMPHATIC TRACER
INTRAMUSCULAR | Status: DC | PRN
  Administered 2022-01-21: 2 mL via INTRAMUSCULAR

## 2022-01-21 MED ORDER — OXYCODONE HCL 5 MG PO TABS
5.0000 mg | ORAL_TABLET | Freq: Once | ORAL | Status: AC | PRN
  Administered 2022-01-21: 5 mg via ORAL

## 2022-01-21 MED ORDER — BUPIVACAINE-EPINEPHRINE (PF) 0.25% -1:200000 IJ SOLN
INTRAMUSCULAR | Status: AC
  Filled 2022-01-21: qty 30

## 2022-01-21 SURGICAL SUPPLY — 46 items
BAG COUNTER SPONGE SURGICOUNT (BAG) ×2 IMPLANT
BINDER BREAST LRG (GAUZE/BANDAGES/DRESSINGS) IMPLANT
BINDER BREAST XLRG (GAUZE/BANDAGES/DRESSINGS) ×1 IMPLANT
BNDG COHESIVE 4X5 TAN STRL (GAUZE/BANDAGES/DRESSINGS) ×2 IMPLANT
CANISTER SUCT 3000ML PPV (MISCELLANEOUS) ×2 IMPLANT
CHLORAPREP W/TINT 26 (MISCELLANEOUS) ×2 IMPLANT
CLIP TI LARGE 6 (CLIP) ×2 IMPLANT
CLIP TI MEDIUM 24 (CLIP) ×2 IMPLANT
CNTNR URN SCR LID CUP LEK RST (MISCELLANEOUS) IMPLANT
CONT SPEC 4OZ STRL OR WHT (MISCELLANEOUS) ×2
COVER PROBE W GEL 5X96 (DRAPES) ×2 IMPLANT
COVER SURGICAL LIGHT HANDLE (MISCELLANEOUS) ×2 IMPLANT
DERMABOND ADVANCED (GAUZE/BANDAGES/DRESSINGS) ×1
DERMABOND ADVANCED .7 DNX12 (GAUZE/BANDAGES/DRESSINGS) ×1 IMPLANT
DEVICE DUBIN SPECIMEN MAMMOGRA (MISCELLANEOUS) ×1 IMPLANT
DRAPE CHEST BREAST 15X10 FENES (DRAPES) ×2 IMPLANT
ELECT COATED BLADE 2.86 ST (ELECTRODE) ×2 IMPLANT
ELECT REM PT RETURN 9FT ADLT (ELECTROSURGICAL) ×2
ELECTRODE REM PT RTRN 9FT ADLT (ELECTROSURGICAL) ×1 IMPLANT
GLOVE BIO SURGEON STRL SZ 6 (GLOVE) ×2 IMPLANT
GLOVE INDICATOR 6.5 STRL GRN (GLOVE) ×2 IMPLANT
GOWN STRL REUS W/ TWL LRG LVL3 (GOWN DISPOSABLE) ×1 IMPLANT
GOWN STRL REUS W/TWL 2XL LVL3 (GOWN DISPOSABLE) ×2 IMPLANT
GOWN STRL REUS W/TWL LRG LVL3 (GOWN DISPOSABLE) ×1
KIT BASIN OR (CUSTOM PROCEDURE TRAY) ×2 IMPLANT
KIT MARKER MARGIN INK (KITS) ×2 IMPLANT
LIGHT WAVEGUIDE WIDE FLAT (MISCELLANEOUS) ×1 IMPLANT
NDL 18GX1X1/2 (RX/OR ONLY) (NEEDLE) IMPLANT
NDL FILTER BLUNT 18X1 1/2 (NEEDLE) IMPLANT
NDL HYPO 25GX1X1/2 BEV (NEEDLE) ×1 IMPLANT
NEEDLE 18GX1X1/2 (RX/OR ONLY) (NEEDLE) ×2 IMPLANT
NEEDLE FILTER BLUNT 18X 1/2SAF (NEEDLE) ×1
NEEDLE FILTER BLUNT 18X1 1/2 (NEEDLE) ×1 IMPLANT
NEEDLE HYPO 25GX1X1/2 BEV (NEEDLE) ×2 IMPLANT
NS IRRIG 1000ML POUR BTL (IV SOLUTION) ×2 IMPLANT
PACK GENERAL/GYN (CUSTOM PROCEDURE TRAY) ×2 IMPLANT
PACK UNIVERSAL I (CUSTOM PROCEDURE TRAY) ×2 IMPLANT
STOCKINETTE IMPERVIOUS 9X36 MD (GAUZE/BANDAGES/DRESSINGS) ×2 IMPLANT
STRIP CLOSURE SKIN 1/2X4 (GAUZE/BANDAGES/DRESSINGS) ×1 IMPLANT
SUT MNCRL AB 4-0 PS2 18 (SUTURE) ×2 IMPLANT
SUT VIC AB 2-0 SH 27 (SUTURE) ×1
SUT VIC AB 2-0 SH 27XBRD (SUTURE) IMPLANT
SUT VIC AB 3-0 SH 8-18 (SUTURE) ×2 IMPLANT
SYR CONTROL 10ML LL (SYRINGE) ×2 IMPLANT
TOWEL GREEN STERILE (TOWEL DISPOSABLE) ×2 IMPLANT
TOWEL GREEN STERILE FF (TOWEL DISPOSABLE) ×2 IMPLANT

## 2022-01-21 NOTE — Interval H&P Note (Signed)
History and Physical Interval Note:  01/21/2022 7:29 AM  Barbara [name]  has presented today for surgery, with the diagnosis of RIGHT BREAST CANCER.  The various methods of treatment have been discussed with the patient and family. After consideration of risks, benefits and other options for treatment, the patient has consented to  Procedure(s) with comments: RIGHT BREAST SEED LOCALIZED LUMPECTOMY AND SENTINEL LYMPH NODE BIOPSY (Right) - GEN & PEC BLOCK as a surgical intervention.  The patient's history has been reviewed, patient examined, no change in status, stable for surgery.  I have reviewed the patient's chart and labs.  Questions were answered to the patient's satisfaction.     Stark Klein

## 2022-01-21 NOTE — Anesthesia Procedure Notes (Signed)
Anesthesia Regional Block: Pectoralis block   Pre-Anesthetic Checklist: , timeout performed,  Correct Patient, Correct Site, Correct Laterality,  Correct Procedure, Correct Position, site marked,  Risks and benefits discussed,  Surgical consent,  Pre-op evaluation,  At surgeon's request and post-op pain management  Laterality: Right  Prep: chloraprep       Needles:  Injection technique: Single-shot  Needle Type: Echogenic Needle     Needle Length: 9cm  Needle Gauge: 21     Additional Needles:   Procedures:,,,, ultrasound used (permanent image in chart),,    Narrative:  Start time: 01/21/2022 6:57 AM End time: 01/21/2022 7:03 AM Injection made incrementally with aspirations every 5 mL.  Performed by: Personally  Anesthesiologist: Annye Asa, MD  Additional Notes: Pt identified in Holding room.  Monitors applied. Working IV access confirmed. Sterile prep R clavicle and pec.  #21ga ECHOgenic Arrow block needle between pec minor, pec major, between ribs 4,5 with US guidance.  30cc 0.5% Bupivacaine 1:200k epi injected incrementally after negative test dose.  Patient asymptomatic, VSS, no heme aspirated, tolerated well.   Jenita Seashore, MD

## 2022-01-21 NOTE — Anesthesia Postprocedure Evaluation (Signed)
Anesthesia Post Note  Patient: Barbara [name]  Procedure(s) Performed: RIGHT BREAST SEED LOCALIZED LUMPECTOMY AND SENTINEL LYMPH NODE BIOPSY (Right: Breast)     Patient location during evaluation: PACU Anesthesia Type: General Level of consciousness: awake and alert, patient cooperative and oriented Pain management: pain level controlled Vital Signs Assessment: post-procedure vital signs reviewed and stable Respiratory status: spontaneous breathing, nonlabored ventilation and respiratory function stable Cardiovascular status: blood pressure returned to baseline and stable Postop Assessment: no apparent nausea or vomiting and adequate PO intake (nausea improving) Anesthetic complications: no   No notable events documented.  Last Vitals:  Vitals:   01/21/22 1110 01/21/22 1125  BP: (!) 153/86 (!) 150/87  Pulse: 84 78  Resp: 15 19  Temp:    SpO2: 97% 97%    Last Pain:  Vitals:   01/21/22 1110  TempSrc:   PainSc: 4                  Luster Hechler,E. Orvell Careaga

## 2022-01-21 NOTE — Discharge Instructions (Addendum)
[facility] Office Phone Number [phone]  BREAST BIOPSY/ PARTIAL MASTECTOMY: POST OP INSTRUCTIONS  Always review your discharge instruction sheet given to you by the facility where your surgery was performed.  IF YOU HAVE DISABILITY OR FAMILY LEAVE FORMS, YOU MUST BRING THEM TO THE OFFICE FOR PROCESSING.  DO NOT GIVE THEM TO YOUR DOCTOR.  Take 2 tylenol (acetominophen) three times a day for 3 days.  If you still have pain, add ibuprofen with food in between if able to take this (if you have kidney issues or stomach issues, do not take ibuprofen).  If both of those are not enough, add the narcotic pain pill.  If you find you are needing a lot of this overnight after surgery, call the next morning for a refill.    Prescriptions will not be filled after 5pm or on week-ends. Take your usually prescribed medications unless otherwise directed You should eat very light the first 24 hours after surgery, such as soup, crackers, pudding, etc.  Resume your normal diet the day after surgery. Most patients will experience some swelling and bruising in the breast.  Ice packs and a good support bra will help.  Swelling and bruising can take several days to resolve.  It is common to experience some constipation if taking pain medication after surgery.  Increasing fluid intake and taking a stool softener will usually help or prevent this problem from occurring.  A mild laxative (Milk of Magnesia or Miralax) should be taken according to package directions if there are no bowel movements after 48 hours. Unless discharge instructions indicate otherwise, you may remove your bandages 48 hours after surgery, and you may shower at that time.  You may have steri-strips (small skin tapes) in place directly over the incision.  These strips should be left on the skin at least for for 7-10 days.    ACTIVITIES:  You may resume regular daily activities (gradually increasing) beginning the next day.  Wearing a  good support bra or sports bra (or the breast binder) minimizes pain and swelling.  You may have sexual intercourse when it is comfortable. No heavy lifting for 1-2 weeks (not over around 10 pounds).  You may drive when you no longer are taking prescription pain medication, you can comfortably wear a seatbelt, and you can safely maneuver your car and apply brakes. RETURN TO WORK:  __________3-14 days depending on job. _______________ You should see your doctor in the office for a follow-up appointment approximately two weeks after your surgery.  Your doctor's nurse will typically make your follow-up appointment when she calls you with your pathology report.  Expect your pathology report 3-4 business days after your surgery.  You may call to check if you do not hear from us after three days.   WHEN TO CALL YOUR DOCTOR: Fever over 101.0 Nausea and/or vomiting. Extreme swelling or bruising. Continued bleeding from incision. Increased pain, redness, or drainage from the incision.  The clinic staff is available to answer your questions during regular business hours.  Please don't hesitate to call and ask to speak to one of the nurses for clinical concerns.  If you have a medical emergency, go to the nearest emergency room or call 911.  A surgeon from [facility] is always on call at the hospital.  For further questions, please visit [email or web address]   

## 2022-01-21 NOTE — Op Note (Signed)
Right Breast Radioactive seed localized lumpectomy and sentinel lymph node biopsy  Indications: This patient presents with history of right breast cancer, lower inner quadrant, cT1-2N0 grade 2 invasive lobular carcinoma, strongly ER/PR +, her 2 neg, Ki 67 10%. Pt also has ALH closer to the nipple.    Pre-operative Diagnosis: right breast cancer  Post-operative Diagnosis: Same  Surgeon: Stark Klein   Anesthesia: General endotracheal anesthesia  ASA Class: 2  Procedure Details  The patient was seen in the Holding Room. The risks, benefits, complications, treatment options, and expected outcomes were discussed with the patient. The possibilities of bleeding, infection, the need for additional procedures, failure to diagnose a condition, and creating a complication requiring transfusion or operation were discussed with the patient. The patient concurred with the proposed plan, giving informed consent.  The site of surgery properly noted/marked. The patient was taken to Operating Room # 1, identified, and the procedure verified as right Breast Seed localized Lumpectomy with sentinel lymph node biopsy. The right arm, breast, and chest were prepped and draped in standard fashion. A Time Out was held and the above information confirmed.  The MagTrace was injected into the subareolar location and massaged for 5 minutes.    The lumpectomy was performed by creating a inferomedial circumareolar incision near the previously placed radioactive seed.  Dissection was carried down to around the point of maximum signal intensity incorporating the tissue going toward the nipple. The cautery was used to perform the dissection.  Hemostasis was achieved with cautery. The edges of the cavity were marked with large clips, with one each medial, lateral, inferior and superior, and two clips posteriorly.   The specimen was inked with the margin marker paint kit.    Specimen radiography confirmed inclusion of the mammographic  lesion, the clips, and the seed.  The background signal in the breast was zero. Some of the adjacent tissue was freed up to help close down the defect.   The wound was irrigated and closed with 3-0 vicryl in layers and 4-0 monocryl subcuticular suture.    Using a Sentimag probe probe, right axillary sentinel nodes were identified transcutaneously.  An oblique incision was created below the axillary hairline.  Dissection was carried through the clavipectoral fascia.  Four deep level 2 axillary sentinel nodes were removed.  Counts per second were >9999, 2100, 450, and 240.    The background count was 70 cps.  The wound was irrigated.  Hemostasis was achieved with cautery.  The axillary incision was closed with a 3-0 vicryl deep dermal interrupted sutures and a 4-0 monocryl subcuticular closure.    Sterile dressings were applied. At the end of the operation, all sponge, instrument, and needle counts were correct.  Findings: grossly clear surgical margins and no adenopathy, posterior margin is pectoralis, anterior and inferior margins are skin  Estimated Blood Loss:  min         Specimens: right breast tissue with seed and four deep right axillary sentinel lymph nodes.             Complications:  None; patient tolerated the procedure well.         Disposition: PACU - hemodynamically stable.         Condition: stable

## 2022-01-21 NOTE — Anesthesia Procedure Notes (Signed)
Procedure Name: LMA Insertion Date/Time: 01/21/2022 7:54 AM  Performed by: Thelma Comp, CRNAPre-anesthesia Checklist: Patient identified, Emergency Drugs available, Suction available and Patient being monitored Patient Re-evaluated:Patient Re-evaluated prior to induction Oxygen Delivery Method: Circle System Utilized Preoxygenation: Pre-oxygenation with 100% oxygen Induction Type: IV induction Ventilation: Mask ventilation without difficulty LMA: LMA inserted LMA Size: 4.0 Number of attempts: 1 Placement Confirmation: positive ETCO2 Tube secured with: Tape Dental Injury: Teeth and Oropharynx as per pre-operative assessment

## 2022-01-21 NOTE — Transfer of Care (Signed)
Immediate Anesthesia Transfer of Care Note  Patient: Barbara [name]  Procedure(s) Performed: RIGHT BREAST SEED LOCALIZED LUMPECTOMY AND SENTINEL LYMPH NODE BIOPSY (Right: Breast)  Patient Location: PACU  Anesthesia Type:General  Level of Consciousness: drowsy and patient cooperative  Airway & Oxygen Therapy: Patient Spontanous Breathing  Post-op Assessment: Report given to RN and Post -op Vital signs reviewed and stable  Post vital signs: Reviewed and stable  Last Vitals:  Vitals Value Taken Time  BP 164/103 01/21/22 1009  Temp    Pulse 90 01/21/22 1011  Resp 19 01/21/22 1011  SpO2 93 % 01/21/22 1011  Vitals shown include unvalidated device data.  Last Pain:  Vitals:   01/21/22 0613  TempSrc:   PainSc: 0-No pain         Complications: No notable events documented.

## 2022-01-22 ENCOUNTER — Encounter (HOSPITAL_COMMUNITY): Payer: Self-pay | Admitting: General Surgery

## 2022-01-27 ENCOUNTER — Other Ambulatory Visit: Payer: Self-pay

## 2022-01-27 ENCOUNTER — Encounter: Payer: Self-pay | Admitting: Hematology and Oncology

## 2022-01-27 ENCOUNTER — Inpatient Hospital Stay: Payer: BC Managed Care – PPO | Attending: Hematology and Oncology | Admitting: Hematology and Oncology

## 2022-01-27 ENCOUNTER — Inpatient Hospital Stay: Payer: BC Managed Care – PPO

## 2022-01-27 VITALS — BP 131/81 | HR 100 | Temp 98.1°F | Resp 18 | Ht 66.0 in | Wt 208.2 lb

## 2022-01-27 DIAGNOSIS — Z801 Family history of malignant neoplasm of trachea, bronchus and lung: Secondary | ICD-10-CM | POA: Diagnosis not present

## 2022-01-27 DIAGNOSIS — Z807 Family history of other malignant neoplasms of lymphoid, hematopoietic and related tissues: Secondary | ICD-10-CM | POA: Diagnosis not present

## 2022-01-27 DIAGNOSIS — C50311 Malignant neoplasm of lower-inner quadrant of right female breast: Secondary | ICD-10-CM | POA: Diagnosis present

## 2022-01-27 DIAGNOSIS — I10 Essential (primary) hypertension: Secondary | ICD-10-CM

## 2022-01-27 DIAGNOSIS — Z170 Estrogen receptor positive status [ER+]: Secondary | ICD-10-CM

## 2022-01-27 NOTE — Progress Notes (Signed)
[facility] CONSULT NOTE  Patient Care Team: Pacifico, Toni Arthurs, FNP as PCP - General (Family Medicine)  CHIEF COMPLAINTS/PURPOSE OF CONSULTATION:   Newly diagnosed breast cancer  HISTORY OF PRESENTING ILLNESS:  Barbara [name] 51 y.o. female is here because of recent diagnosis of right breast cancer  I reviewed her records extensively and collaborated the history with the patient.  SUMMARY OF ONCOLOGIC HISTORY: Oncology History  Breast cancer of lower-inner quadrant of right female breast ([place])  01/20/2022 Initial Diagnosis   Breast cancer of lower-inner quadrant of right female breast ([place])   01/20/2022 Cancer Staging   Staging form: Breast, AJCC 8th Edition - Clinical stage from 01/20/2022: Stage IB (cT2, cN0, cM0, G2, ER+, PR+, HER2-) - Signed by Eppie Gibson, MD on 01/20/2022 Stage prefix: Initial diagnosis Histologic grading system: 3 grade system     This is a very pleasant 51 year old female patient with screen detected right breast mass status post core needle biopsy showing intermediate grade invasive lobular carcinoma, ER/PR strongly +90%, HER2 negative 1+, Ki-67 of 2% status post right breast lumpectomy and sentinel lymph node biopsy with final pathology showing invasive lobular carcinoma, grade 2 and an incidental well-differentiated tubular carcinoma, grade 1.  Lobular carcinoma measures 3.3 x 2.5 x 1.5 cm and the tubular carcinoma measures 1.2 mm in greatest dimension.  Invasive carcinoma transected and inferior margin, 0.5 mm, extensive lobular carcinoma in situ and atypical lobular hyperplasia.  Sentinel lymph nodes negative, no evidence of carcinoma.  Final pathologic staging was PT2N0. She is now referred to medical oncology for adjuvant recommendations. She is recovering well from surgery.  She denies any postsurgical pain.  No family history of breast cancer.  Mom had myeloma and dad had lung cancer.  She denies any birth control or hormone replacement therapy.   She has 3 children and age at first childbirth 26, no history of breast-feeding. Rest of the pertinent 10 point ROS reviewed and negative  MEDICAL HISTORY:  Past Medical History:  Diagnosis Date   Hypertension     SURGICAL HISTORY: Past Surgical History:  Procedure Laterality Date   BREAST LUMPECTOMY WITH RADIOACTIVE SEED AND SENTINEL LYMPH NODE BIOPSY Right 01/21/2022   Procedure: RIGHT BREAST SEED LOCALIZED LUMPECTOMY AND SENTINEL LYMPH NODE BIOPSY;  Surgeon: Stark Klein, MD;  Location: [facility];  Service: General;  Laterality: Right;  GEN & PEC BLOCK    SOCIAL HISTORY: Social History   Socioeconomic History   Marital status: Married    Spouse name: Anglia Blakley   Number of children: 3   Years of education: Not on file   Highest education level: Not on file  Occupational History   Not on file  Tobacco Use   Smoking status: Never   Smokeless tobacco: Never  Vaping Use   Vaping Use: Never used  Substance and Sexual Activity   Alcohol use: Never   Drug use: Never   Sexual activity: Yes  Other Topics Concern   Not on file  Social History Narrative   Not on file   Social Determinants of Health   Financial Resource Strain: Not on file  Food Insecurity: Not on file  Transportation Needs: Not on file  Physical Activity: Not on file  Stress: Not on file  Social Connections: Not on file  Intimate Partner Violence: Not on file    FAMILY HISTORY: Family History  Problem Relation Age of Onset   Multiple myeloma Mother    Lung cancer Father  ALLERGIES:  has No Known Allergies.  MEDICATIONS:  Current Outpatient Medications  Medication Sig Dispense Refill   losartan-hydrochlorothiazide (HYZAAR) 100-12.5 MG tablet Take 1 tablet by mouth daily.     Omega-3 Fatty Acids (FISH OIL) 1000 MG CAPS Take 2 capsules by mouth daily.     oxyCODONE (OXY IR/ROXICODONE) 5 MG immediate release tablet Take 1 tablet (5 mg total) by mouth every 6 (six) hours as needed for  severe pain. 10 tablet 0   No current facility-administered medications for this visit.    REVIEW OF SYSTEMS:   Constitutional: Denies fevers, chills or abnormal night sweats Eyes: Denies blurriness of vision, double vision or watery eyes Ears, nose, mouth, throat, and face: Denies mucositis or sore throat Respiratory: Denies cough, dyspnea or wheezes Cardiovascular: Denies palpitation, chest discomfort or lower extremity swelling Gastrointestinal:  Denies nausea, heartburn or change in bowel habits Skin: Denies abnormal skin rashes Lymphatics: Denies new lymphadenopathy or easy bruising Neurological:Denies numbness, tingling or new weaknesses Behavioral/Psych: Mood is stable, no new changes  Breast: Denies any palpable lumps or discharge All other systems were reviewed with the patient and are negative.  PHYSICAL EXAMINATION: ECOG PERFORMANCE STATUS: 0 - Asymptomatic  Vitals:   01/27/22 1035  BP: 131/81  Pulse: 100  Resp: 18  Temp: 98.1 F (36.7 C)  SpO2: 97%   Filed Weights   01/27/22 1035  Weight: 208 lb 3.2 oz (94.4 kg)    GENERAL:alert, no distress and comfortable Breast: Bilateral breasts inspected and palpated.  No palpable masses or regional adenopathy  LABORATORY DATA:  I have reviewed the data as listed Lab Results  Component Value Date   WBC 8.6 01/17/2022   HGB 15.3 (H) 01/17/2022   HCT 44.8 01/17/2022   MCV 88.7 01/17/2022   PLT 330 01/17/2022   Lab Results  Component Value Date   NA 140 01/17/2022   K 3.8 01/17/2022   CL 106 01/17/2022   CO2 26 01/17/2022    RADIOGRAPHIC STUDIES: I have personally reviewed the radiological reports and agreed with the findings in the report.  ASSESSMENT AND PLAN:  Breast cancer of lower-inner quadrant of right female breast [facility]) This is a very pleasant 51 year old female patient with newly diagnosed right breast invasive lobular carcinoma, ER/PR strongly +90%, HER2 negative Ki-67 of 2%.  She is status post  right breast lumpectomy and sentinel lymph node biopsy with final pathology confirming invasive lobular carcinoma grade 2 as well as an incidental well-differentiated tubular carcinoma, grade 1.  Lobular carcinoma measured 3.3 x 2.5 x 1.5 cm and the tubular carcinoma measured 1.2 mm in greatest extent.  Sentinel lymph nodes negative.  She is here to discuss adjuvant recommendations.  Today we have recommended Oncotype testing and we have discussed the following details about Oncotype testing.  We have discussed about Oncotype Dx score which is a well validated prognostic scoring system which can predict outcome with endocrine therapy alone and whether chemotherapy reduces recurrence.  Typically in patients with ER positive cancers that are node negative if the RS score is high typically greater than or equal to 26, chemotherapy is recommended.  In women with intermediate recurrence score younger than 37, there can still be some role for chemotherapy in addition to endocrine therapy especially if the recurrence score is between 21-25. If chemotherapy is needed, this will precede radiation and then after radiation she will continue on antiestrogen therapy. She is agreeable to Oncotype testing.  This has been ordered. We also discussed adjuvant  antiestrogen therapy options.  We have discussed about tamoxifen versus aromatase inhibitors, mechanism of action and adverse effects with each class of drugs.  For now she is considering aromatase inhibitors for antiestrogen therapy.  We will again discuss this once she completes adjuvant radiation.  We have discussed about duration of therapy for 5 to 10 years, preferably around 7 years at least.  Discussed about genetic testing, continuing lifestyle interventions such as regular exercise, healthy diet with more focus on plant-based diet, limiting alcohol intake and avoiding any hormone replacement therapy.  She is interested in genetic testing.  Ambulatory referral to  genetics placed. Return to clinic in about 3 weeks to review Oncotype results and to discuss additional recommendations.   All questions were answered. The patient knows to call the clinic with any problems, questions or concerns.    Benay Pike, MD 01/27/22

## 2022-01-27 NOTE — Assessment & Plan Note (Signed)
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  She is status post right breast lumpectomy and sentinel lymph node biopsy with final pathology confirming invasive lobular carcinoma grade 2 as well as an incidental well-differentiated tubular carcinoma, grade 1.  Lobular carcinoma measured 3.3 x 2.5 x 1.5 cm and the tubular carcinoma measured 1.2 mm in greatest extent.  Sentinel lymph nodes negative.  She is here to discuss adjuvant recommendations.  Today we have recommended Oncotype testing and we have discussed the following details about Oncotype testing.  We have discussed about Oncotype Dx score which is a well validated prognostic scoring system which can predict outcome with endocrine therapy alone and whether chemotherapy reduces recurrence.  Typically in patients with ER positive cancers that are node negative if the RS score is high typically greater than or equal to 26, chemotherapy is recommended.  In women with intermediate recurrence score younger than 82, there can still be some role for chemotherapy in addition to endocrine therapy especially if the recurrence score is between 21-25. If chemotherapy is needed, this will precede radiation and then after radiation she will continue on antiestrogen therapy. She is agreeable to Oncotype testing.  This has been ordered. We also discussed adjuvant antiestrogen therapy options.  We have discussed about tamoxifen versus aromatase inhibitors, mechanism of action and adverse effects with each class of drugs.  For now she is considering aromatase inhibitors for antiestrogen therapy.  We will again discuss this once she completes adjuvant radiation.  We have discussed about duration of therapy for 5 to 10 years, preferably around 7 years at least.  Discussed about genetic testing, continuing lifestyle interventions such as regular exercise,  healthy diet with more focus on plant-based diet, limiting alcohol intake and avoiding any hormone replacement therapy.  She is interested in genetic testing.  Ambulatory referral to genetics placed. Return to clinic in about 3 weeks to review Oncotype results and to discuss additional recommendations.

## 2022-01-28 ENCOUNTER — Encounter: Payer: Self-pay | Admitting: *Deleted

## 2022-01-28 ENCOUNTER — Encounter (HOSPITAL_COMMUNITY): Payer: Self-pay

## 2022-01-28 ENCOUNTER — Telehealth: Payer: Self-pay | Admitting: *Deleted

## 2022-01-28 NOTE — Telephone Encounter (Signed)
Called pt to provided navigation resources and contact information. Pt denies questions or concerns regarding dx or treatment care plan. Encourage pt to call with needs. Received verbal understanding.

## 2022-02-03 ENCOUNTER — Inpatient Hospital Stay: Payer: BC Managed Care – PPO | Attending: Hematology and Oncology | Admitting: Licensed Clinical Social Worker

## 2022-02-03 DIAGNOSIS — Z801 Family history of malignant neoplasm of trachea, bronchus and lung: Secondary | ICD-10-CM | POA: Insufficient documentation

## 2022-02-03 DIAGNOSIS — Z807 Family history of other malignant neoplasms of lymphoid, hematopoietic and related tissues: Secondary | ICD-10-CM | POA: Insufficient documentation

## 2022-02-03 DIAGNOSIS — Z170 Estrogen receptor positive status [ER+]: Secondary | ICD-10-CM | POA: Insufficient documentation

## 2022-02-03 DIAGNOSIS — C50311 Malignant neoplasm of lower-inner quadrant of right female breast: Secondary | ICD-10-CM | POA: Insufficient documentation

## 2022-02-03 DIAGNOSIS — I10 Essential (primary) hypertension: Secondary | ICD-10-CM | POA: Insufficient documentation

## 2022-02-03 NOTE — Progress Notes (Signed)
Barbara [name]  Initial Assessment   Barbara [name] is a 51 y.o. year old female contacted by phone. Clinical Social [name] was referred by new patient protocol for assessment of psychosocial needs.   SDOH (Social Determinants of Health) assessments performed: Yes SDOH Interventions    Flowsheet Row Most Recent Value  SDOH Interventions   Food Insecurity Interventions Intervention Not Indicated  Financial Strain Interventions Intervention Not Indicated  Transportation Interventions Intervention Not Indicated       SDOH Screenings   Alcohol Screen: Not on file  Depression (QQV9-5): Not on file  Financial Resource Strain: Low Risk  (02/03/2022)   Overall Financial Resource Strain (CARDIA)    Difficulty of Paying Living Expenses: Not very hard  Food Insecurity: No Food Insecurity (02/03/2022)   Hunger Vital Sign    Worried About Running Out of Food in the Last Year: Never true    Ran Out of Food in the Last Year: Never true  Housing: Not on file  Physical Activity: Not on file  Social Connections: Not on file  Stress: Not on file  Tobacco Use: Low Risk  (01/27/2022)   Patient History    Smoking Tobacco Use: Never    Smokeless Tobacco Use: Never    Passive Exposure: Not on file  Transportation Needs: No Transportation Needs (02/03/2022)   PRAPARE - Transportation    Lack of Transportation (Medical): No    Lack of Transportation (Non-Medical): No     Distress Screen completed: No     No data to display            Family/Social Information:  Housing Arrangement: patient lives with husband.  3 adult children outside the home Family members/support persons in your life? Family Transportation concerns: no  Financial concerns: No Type of concern: None Food access concerns: no Religious or spiritual practice: Not known Services Currently in place:  n/a  Coping/ Adjustment to diagnosis: Patient understands treatment plan and what happens next? yes, already had  lumpectomy and pt thought it went better than she expected Hopes and/or priorities: hopes that the rest of treatment goes smoothly    SUMMARY: Current SDOH Barriers:  None noted today  Clinical Social [name] Clinical Goal(s):  No clinical social [name] goals at this time  Interventions: Discussed the importance of support during treatment Informed patient of the support team roles and support services at [facility] Provided [place] contact information and encouraged patient to call with any questions or concerns   Follow Up Plan: Patient will contact CSW with any support or resource needs Patient verbalizes understanding of plan: Yes    Rembert Browe E Taron Mondor, LCSW

## 2022-02-06 ENCOUNTER — Encounter (HOSPITAL_COMMUNITY): Payer: Self-pay

## 2022-02-11 ENCOUNTER — Encounter: Payer: Self-pay | Admitting: *Deleted

## 2022-02-11 ENCOUNTER — Telehealth: Payer: Self-pay | Admitting: *Deleted

## 2022-02-11 DIAGNOSIS — Z170 Estrogen receptor positive status [ER+]: Secondary | ICD-10-CM

## 2022-02-11 NOTE — Telephone Encounter (Signed)
Received oncotype score of 18. Physician team notified. Called pt with results. Informed chemo not recommended and next step is xrt with Dr. Isidore Moos. Received verbal understanding. Denies further questions or needs at this time.  Referral placed to see Dr. Isidore Moos.

## 2022-02-17 ENCOUNTER — Encounter: Payer: Self-pay | Admitting: Hematology and Oncology

## 2022-02-17 ENCOUNTER — Inpatient Hospital Stay (HOSPITAL_BASED_OUTPATIENT_CLINIC_OR_DEPARTMENT_OTHER): Payer: BC Managed Care – PPO | Admitting: Hematology and Oncology

## 2022-02-17 ENCOUNTER — Other Ambulatory Visit: Payer: Self-pay

## 2022-02-17 DIAGNOSIS — I10 Essential (primary) hypertension: Secondary | ICD-10-CM | POA: Diagnosis not present

## 2022-02-17 DIAGNOSIS — Z801 Family history of malignant neoplasm of trachea, bronchus and lung: Secondary | ICD-10-CM | POA: Diagnosis not present

## 2022-02-17 DIAGNOSIS — Z170 Estrogen receptor positive status [ER+]: Secondary | ICD-10-CM | POA: Diagnosis not present

## 2022-02-17 DIAGNOSIS — Z807 Family history of other malignant neoplasms of lymphoid, hematopoietic and related tissues: Secondary | ICD-10-CM | POA: Diagnosis not present

## 2022-02-17 DIAGNOSIS — C50311 Malignant neoplasm of lower-inner quadrant of right female breast: Secondary | ICD-10-CM | POA: Diagnosis present

## 2022-02-17 NOTE — Assessment & Plan Note (Addendum)
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  She is status post right breast lumpectomy and sentinel lymph node biopsy with final pathology confirming invasive lobular carcinoma grade 2 as well as an incidental well-differentiated tubular carcinoma, grade 1.  Lobular carcinoma measured 3.3 x 2.5 x 1.5 cm and the tubular carcinoma measured 1.2 mm in greatest extent.  Sentinel lymph nodes negative.  She is here to discuss adjuvant recommendations.    Oncotype Dx of 18, no benefit of chemotherapy.  She will proceed with adjuvant radiation and follow with antiestrogen therapy.  I once again discussed about role of tamoxifen versus aromatase inhibitors.  We have discussed mechanism of action with each class and adverse effects with each class.  She will return to clinic in mid October to follow-up on antiestrogen therapy initiated on antiestrogen therapy.  At this time, she appears to be leaning towards aromatase inhibitors.  I have offered both a televisit as well as an in person visit.  Patient would like to come back for in person visit.

## 2022-02-17 NOTE — Progress Notes (Signed)
[facility] CONSULT NOTE  Patient Care Team: Pacifico, Toni Arthurs, FNP as PCP - General (Family Medicine) Mauro Kaufmann, RN as Oncology Nurse Navigator Rockwell Germany, RN as Oncology Nurse Navigator  CHIEF COMPLAINTS/PURPOSE OF CONSULTATION:   Breast cancer status post surgery  HISTORY OF PRESENTING ILLNESS:  Barbara [name] 51 y.o. female is here because of recent diagnosis of right breast cancer  I reviewed her records extensively and collaborated the history with the patient.  SUMMARY OF ONCOLOGIC HISTORY: Oncology History  Breast cancer of lower-inner quadrant of right female breast ([place])  01/20/2022 Initial Diagnosis   Breast cancer of lower-inner quadrant of right female breast ([place])   01/20/2022 Cancer Staging   Staging form: Breast, AJCC 8th Edition - Clinical stage from 01/20/2022: Stage IB (cT2, cN0, cM0, G2, ER+, PR+, HER2-) - Signed by Eppie Gibson, MD on 01/20/2022 Stage prefix: Initial diagnosis Histologic grading system: 3 grade system    Definitive Surgery   Status post right breast lumpectomy and sentinel lymph node biopsy with final pathology showing invasive lobular carcinoma, grade 2 and an incidental well-differentiated tubular carcinoma, grade 1.  Lobular carcinoma measures 3.3 x 2.5 x 1.5 cm and the tubular carcinoma measures 1.2 mm in greatest dimension.  Invasive carcinoma transected and inferior margin, 0.5 mm, extensive lobular carcinoma in situ and atypical lobular hyperplasia.  Sentinel lymph nodes negative, no evidence of carcinoma.   Final pathologic staging was PT2N0.    Oncotype testing   Oncotype DX of 18, distant recurrence risk at 9 years of 5% and chemotherapy benefit less than 1%     Interval history  She is here for follow-up.  She is doing quite well since surgery, healing well.  She will be meeting Dr. Isidore Moos for adjuvant radiation soon.  MEDICAL HISTORY:  Past Medical History:  Diagnosis Date   Hypertension     SURGICAL  HISTORY: Past Surgical History:  Procedure Laterality Date   BREAST LUMPECTOMY WITH RADIOACTIVE SEED AND SENTINEL LYMPH NODE BIOPSY Right 01/21/2022   Procedure: RIGHT BREAST SEED LOCALIZED LUMPECTOMY AND SENTINEL LYMPH NODE BIOPSY;  Surgeon: Stark Klein, MD;  Location: [facility];  Service: General;  Laterality: Right;  GEN & PEC BLOCK    SOCIAL HISTORY: Social History   Socioeconomic History   Marital status: Married    Spouse name: Anelis Hrivnak   Number of children: 3   Years of education: Not on file   Highest education level: Not on file  Occupational History   Not on file  Tobacco Use   Smoking status: Never   Smokeless tobacco: Never  Vaping Use   Vaping Use: Never used  Substance and Sexual Activity   Alcohol use: Never   Drug use: Never   Sexual activity: Yes  Other Topics Concern   Not on file  Social History Narrative   Not on file   Social Determinants of Health   Financial Resource Strain: Low Risk  (02/03/2022)   Overall Financial Resource Strain (CARDIA)    Difficulty of Paying Living Expenses: Not very hard  Food Insecurity: No Food Insecurity (02/03/2022)   Hunger Vital Sign    Worried About Running Out of Food in the Last Year: Never true    Ran Out of Food in the Last Year: Never true  Transportation Needs: No Transportation Needs (02/03/2022)   PRAPARE - Hydrologist (Medical): No    Lack of Transportation (Non-Medical): No  Physical Activity: Not  on file  Stress: Not on file  Social Connections: Not on file  Intimate Partner Violence: Not on file    FAMILY HISTORY: Family History  Problem Relation Age of Onset   Multiple myeloma Mother    Lung cancer Father     ALLERGIES:  has No Known Allergies.  MEDICATIONS:  Current Outpatient Medications  Medication Sig Dispense Refill   losartan-hydrochlorothiazide (HYZAAR) 100-12.5 MG tablet Take 1 tablet by mouth daily.     Omega-3 Fatty Acids (FISH OIL) 1000 MG  CAPS Take 2 capsules by mouth daily.     No current facility-administered medications for this visit.    REVIEW OF SYSTEMS:   Constitutional: Denies fevers, chills or abnormal night sweats Eyes: Denies blurriness of vision, double vision or watery eyes Ears, nose, mouth, throat, and face: Denies mucositis or sore throat Respiratory: Denies cough, dyspnea or wheezes Cardiovascular: Denies palpitation, chest discomfort or lower extremity swelling Gastrointestinal:  Denies nausea, heartburn or change in bowel habits Skin: Denies abnormal skin rashes Lymphatics: Denies new lymphadenopathy or easy bruising Neurological:Denies numbness, tingling or new weaknesses Behavioral/Psych: Mood is stable, no new changes  Breast: Denies any palpable lumps or discharge All other systems were reviewed with the patient and are negative.  PHYSICAL EXAMINATION: ECOG PERFORMANCE STATUS: 0 - Asymptomatic  Vitals:   02/17/22 0830  BP: 136/84  Pulse: 97  Resp: 18  Temp: 97.9 F (36.6 C)  SpO2: 97%    Filed Weights   02/17/22 0830  Weight: 211 lb 6.4 oz (95.9 kg)     GENERAL:alert, no distress and comfortable Breast: Right breast surgical scar and axillary scar appears to be healing well  LABORATORY DATA:  I have reviewed the data as listed Lab Results  Component Value Date   WBC 8.6 01/17/2022   HGB 15.3 (H) 01/17/2022   HCT 44.8 01/17/2022   MCV 88.7 01/17/2022   PLT 330 01/17/2022   Lab Results  Component Value Date   NA 140 01/17/2022   K 3.8 01/17/2022   CL 106 01/17/2022   CO2 26 01/17/2022    RADIOGRAPHIC STUDIES: I have personally reviewed the radiological reports and agreed with the findings in the report.  ASSESSMENT AND PLAN:  Breast cancer of lower-inner quadrant of right female breast [facility]) This is a very pleasant 51 year old female patient with newly diagnosed right breast invasive lobular carcinoma, ER/PR strongly +90%, HER2 negative Ki-67 of 2%.  She is status  post right breast lumpectomy and sentinel lymph node biopsy with final pathology confirming invasive lobular carcinoma grade 2 as well as an incidental well-differentiated tubular carcinoma, grade 1.  Lobular carcinoma measured 3.3 x 2.5 x 1.5 cm and the tubular carcinoma measured 1.2 mm in greatest extent.  Sentinel lymph nodes negative.  She is here to discuss adjuvant recommendations.    Oncotype Dx of 18, no benefit of chemotherapy.  She will proceed with adjuvant radiation and follow with antiestrogen therapy.  I once again discussed about role of tamoxifen versus aromatase inhibitors.  We have discussed mechanism of action with each class and adverse effects with each class.  She will return to clinic in mid October to follow-up on antiestrogen therapy initiated on antiestrogen therapy.  At this time, she appears to be leaning towards aromatase inhibitors.  I have offered both a televisit as well as an in person visit.  Patient would like to come back for in person visit.  Total time spent 20 minutes including history, physical exam, review of  records, counseling and coordination of care All questions were answered. The patient knows to call the clinic with any problems, questions or concerns.    Benay Pike, MD 02/17/22

## 2022-02-17 NOTE — Progress Notes (Signed)
Location of Breast Cancer: Right breast  Histology per Pathology Report: 01/21/2022 FINAL MICROSCOPIC DIAGNOSIS:   A. RIGHT BREAST, LUMPECTOMY:  Invasive lobular carcinoma, grade 2 (3+2+1) and an incidental  well-differentiated tubular carcinoma, grade 1 (1+1+1)  Lobular carcinoma measures 3.3 x 2.5 x 1.5 cm and the tubular carcinoma  measures 1.2 mm in greatest dimension (mpT2)  Invasive carcinoma transected and inferior margin (0.5 mm from anterior  margin)  Extensive lobular carcinoma in situ and atypical lobular hyperplasia  Changes consistent with prior biopsies  Background fibrocystic changes including stromal fibrosis and adenosis  Microcalcifications present within invasive tumor, ALH and benign  adenosis   B. RIGHT AXILLARY SENTINEL LYMPH NODE #1, EXCISION:  One benign lymph node, negative for carcinoma (0/1)   C. RIGHT AXILLARY SENTINEL LYMPH NODE #2, EXCISION:  Benign fibroadipose  Negative for lymph node   D. RIGHT AXILLARY SENTINEL LYMPH NODE #3, EXCISION:  One benign lymph node, negative for carcinoma (0/1)   E. RIGHT AXILLARY SENTINEL LYMPH NODE #4, EXCISION:  One benign lymph node, negative for carcinoma (0/1)   Receptor Status: ER(positive 90%), PR (Positive 90%), Her2-neu (Negative 1 +) , Ki-67(2%)  Did patient present with symptoms (if so, please note symptoms) or was this found on screening mammography?:   Past/Anticipated interventions by surgeon, if any: 01/21/2022  RIGHT BREAST SEED LOCALIZED LUMPECTOMY AND SENTINEL LYMPH NODE BIOPSY Right  Dr. Barry Dienes    Past/Anticipated interventions by medical oncology, if any:  Oncotype Dx of 18, no benefit of chemotherapy.  She will proceed with adjuvant radiation and follow with antiestrogen therapy.  I once again discussed about role of tamoxifen versus aromatase inhibitors.  We have discussed mechanism of action with each class and adverse effects with each class.  She will return to clinic in mid October to  follow-up on antiestrogen therapy initiated on antiestrogen therapy.  Lymphedema issues, if any:   None  Pain issues, if any:  No  SAFETY ISSUES: Prior radiation? No Pacemaker/ICD? No Possible current pregnancy?No Is the patient on methotrexate? no  Current Complaints / other details:       Vitals:   02/24/22 0758  BP: 139/89  Pulse: 81  Resp: 18  Temp: 97.8 F (36.6 C)  TempSrc: Oral  SpO2: 100%  Weight: 95.7 kg

## 2022-02-21 LAB — SURGICAL PATHOLOGY

## 2022-02-23 NOTE — Progress Notes (Signed)
Radiation Oncology         [phone] ________________________________  Name: Barbara [name] MRN: [id number]  Date: 02/24/2022  DOB: Nov 14, 1970  Follow-Up Visit Note  Outpatient  CC: Francis Gaines, FNP  Benay Pike, MD  Diagnosis:   No diagnosis found.   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     Since consultation date of 01/20/22, the patient opted to proceed with right breast lumpectomy with SLN biopsies on 01/21/22 under the care of Dr. Barry Dienes. Pathology from the procedure revealed: histology of grade 2 invasive and in-situ lobular carcinoma measuring 3.3 cm, with incidentally found grade 1 well-differentiated tubular carcinoma measuring 1.2 mm in the greatest dimension ; margin status to invasive disease of 0.5 mm from the anterior margin; margin status to in situ disease not indicated on pathology; nodal status of 4/4 right axillary SLN excisions negative for carcinoma;  ER status: 90% positive and PR status 90% positive, both with strong staining intensity; Proliferation marker Ki67 at 2%; Her2 status negative; Grade 2.  Oncotype DX was obtained on the final surgical sample and the recurrence score of 18 predicts a risk of recurrence outside the breast over the next 9 years of 5%, if the patient's only systemic therapy is an antiestrogen for 5 years.  It also predicts no significant benefit from chemotherapy.  Given Oncotype results, the patient will not require chemotherapy and will proceed with antiestrogen therapy or AI following XRT. Per her most recent follow-up visit with Dr. Chryl Heck on 02/17/22, the patient has opted to return to Dr. Chryl Heck in mid October to make a final decision regarding her therapy type.   Symptomatically, the  patient reports: ***        ALLERGIES:  has No Known Allergies.  Meds: Current Outpatient Medications  Medication Sig Dispense Refill   losartan-hydrochlorothiazide (HYZAAR) 100-12.5 MG tablet Take 1 tablet by mouth daily.     Omega-3 Fatty Acids (FISH OIL) 1000 MG CAPS Take 2 capsules by mouth daily.     No current facility-administered medications for this encounter.    Physical Findings:  vitals were not taken for this visit. .     General: Alert and oriented, in no acute distress HEENT: Head is normocephalic. Extraocular movements are intact. Oropharynx is clear. Neck: Neck is supple, no palpable cervical or supraclavicular lymphadenopathy. Heart: Regular in rate and rhythm with no murmurs, rubs, or gallops. Chest: Clear to auscultation bilaterally, with no rhonchi, wheezes, or rales. Abdomen: Soft, nontender, nondistended, with no rigidity or guarding. Extremities: No cyanosis or edema. Lymphatics: see Neck Exam Musculoskeletal: symmetric strength and muscle tone throughout. Neurologic: No obvious focalities. Speech is fluent.  Psychiatric: Judgment and insight are intact. Affect is appropriate. Breast exam reveals ***  Lab Findings: Lab Results  Component Value Date   WBC 8.6 01/17/2022   HGB 15.3 (H) 01/17/2022   HCT 44.8 01/17/2022   MCV 88.7 01/17/2022   PLT 330 01/17/2022    _0 @  Radiographic Findings: No results found.  Impression/Plan: We discussed adjuvant radiotherapy today.  I recommend *** in order to ***.  I reviewed the logistics, benefits, risks, and potential side effects of this treatment in detail. Risks may include but not necessary be limited to acute and late injury tissue in the radiation  fields such as skin irritation (change in color/pigmentation, itching, dryness, pain, peeling). She may experience fatigue. We also discussed possible risk of long term cosmetic changes or scar tissue. There is also a smaller risk for lung toxicity,  ***cardiac toxicity, ***brachial plexopathy, ***lymphedema, ***musculoskeletal changes, ***rib fragility or ***induction of a second malignancy, ***late chronic non-healing soft tissue wound.    The patient asked good questions which I answered to her satisfaction. She is enthusiastic about proceeding with treatment. A consent form has been *** signed and placed in her chart.  A total of *** medically necessary complex treatment devices will be fabricated and supervised by me: *** fields with MLCs for custom blocks to protect heart, and lungs;  and, a Vac-lok. MORE COMPLEX DEVICES MAY BE MADE IN DOSIMETRY FOR FIELD IN FIELD BEAMS FOR DOSE HOMOGENEITY.  I have requested : 3D Simulation which is medically necessary to give adequate dose to at risk tissues while sparing lungs and heart.  I have requested a DVH of the following structures: lungs, heart, *** lumpectomy cavity.    The patient will receive *** Gy in *** fractions to the *** with *** fields.  This will be *** followed by a boost.  On date of service, in total, I spent *** minutes on this encounter. Patient was seen in person.  _____________________________________   Eppie Gibson, MD  This document serves as a record of services personally performed by Eppie Gibson, MD. It was created on her behalf by Roney Mans, a trained medical scribe. The creation of this record is based on the scribe's personal observations and the provider's statements to them. This document has been checked and approved by the attending provider.

## 2022-02-24 ENCOUNTER — Ambulatory Visit
Admission: RE | Admit: 2022-02-24 | Discharge: 2022-02-24 | Disposition: A | Discharge status: Home / Self Care | Payer: BC Managed Care – PPO | Source: Ambulatory Visit | Attending: Radiation Oncology | Admitting: Radiation Oncology

## 2022-02-24 ENCOUNTER — Encounter: Payer: Self-pay | Admitting: Radiation Oncology

## 2022-02-24 ENCOUNTER — Other Ambulatory Visit: Payer: Self-pay

## 2022-02-24 VITALS — BP 139/89 | HR 81 | Temp 97.8°F | Resp 18 | Wt 211.1 lb

## 2022-02-24 DIAGNOSIS — Z170 Estrogen receptor positive status [ER+]: Secondary | ICD-10-CM | POA: Insufficient documentation

## 2022-02-24 DIAGNOSIS — C50311 Malignant neoplasm of lower-inner quadrant of right female breast: Secondary | ICD-10-CM | POA: Insufficient documentation

## 2022-02-24 DIAGNOSIS — Z923 Personal history of irradiation: Secondary | ICD-10-CM | POA: Diagnosis not present

## 2022-02-24 DIAGNOSIS — Z79899 Other long term (current) drug therapy: Secondary | ICD-10-CM | POA: Insufficient documentation

## 2022-02-24 LAB — PREGNANCY, URINE: Preg Test, Ur: NEGATIVE

## 2022-02-28 DIAGNOSIS — Z510 Encounter for antineoplastic radiation therapy: Secondary | ICD-10-CM | POA: Diagnosis not present

## 2022-02-28 DIAGNOSIS — C50311 Malignant neoplasm of lower-inner quadrant of right female breast: Secondary | ICD-10-CM | POA: Diagnosis present

## 2022-02-28 DIAGNOSIS — Z170 Estrogen receptor positive status [ER+]: Secondary | ICD-10-CM | POA: Insufficient documentation

## 2022-03-04 ENCOUNTER — Encounter: Payer: Self-pay | Admitting: *Deleted

## 2022-03-05 ENCOUNTER — Other Ambulatory Visit: Payer: Self-pay

## 2022-03-05 ENCOUNTER — Ambulatory Visit
Admission: RE | Admit: 2022-03-05 | Discharge: 2022-03-05 | Disposition: A | Discharge status: Home / Self Care | Payer: BC Managed Care – PPO | Source: Ambulatory Visit | Attending: Radiation Oncology | Admitting: Radiation Oncology

## 2022-03-05 DIAGNOSIS — Z510 Encounter for antineoplastic radiation therapy: Secondary | ICD-10-CM | POA: Diagnosis not present

## 2022-03-05 LAB — RAD ONC ARIA SESSION SUMMARY
Course Elapsed Days: 0
Plan Fractions Treated to Date: 1
Plan Prescribed Dose Per Fraction: 2.67 Gy
Plan Total Fractions Prescribed: 15
Plan Total Prescribed Dose: 40.05 Gy
Reference Point Dosage Given to Date: 2.67 Gy
Reference Point Session Dosage Given: 2.67 Gy
Session Number: 1

## 2022-03-06 ENCOUNTER — Ambulatory Visit
Admission: RE | Admit: 2022-03-06 | Discharge: 2022-03-06 | Disposition: A | Discharge status: Home / Self Care | Payer: BC Managed Care – PPO | Source: Ambulatory Visit | Attending: Radiation Oncology | Admitting: Radiation Oncology

## 2022-03-06 ENCOUNTER — Other Ambulatory Visit: Payer: Self-pay

## 2022-03-06 DIAGNOSIS — Z510 Encounter for antineoplastic radiation therapy: Secondary | ICD-10-CM | POA: Diagnosis not present

## 2022-03-06 LAB — RAD ONC ARIA SESSION SUMMARY
Course Elapsed Days: 1
Plan Fractions Treated to Date: 2
Plan Prescribed Dose Per Fraction: 2.67 Gy
Plan Total Fractions Prescribed: 15
Plan Total Prescribed Dose: 40.05 Gy
Reference Point Dosage Given to Date: 5.34 Gy
Reference Point Session Dosage Given: 2.67 Gy
Session Number: 2

## 2022-03-07 ENCOUNTER — Other Ambulatory Visit: Payer: Self-pay

## 2022-03-07 ENCOUNTER — Ambulatory Visit
Admission: RE | Admit: 2022-03-07 | Discharge: 2022-03-07 | Disposition: A | Discharge status: Home / Self Care | Payer: BC Managed Care – PPO | Source: Ambulatory Visit | Attending: Radiation Oncology | Admitting: Radiation Oncology

## 2022-03-07 DIAGNOSIS — Z510 Encounter for antineoplastic radiation therapy: Secondary | ICD-10-CM | POA: Diagnosis not present

## 2022-03-07 LAB — RAD ONC ARIA SESSION SUMMARY
Course Elapsed Days: 2
Plan Fractions Treated to Date: 3
Plan Prescribed Dose Per Fraction: 2.67 Gy
Plan Total Fractions Prescribed: 15
Plan Total Prescribed Dose: 40.05 Gy
Reference Point Dosage Given to Date: 8.01 Gy
Reference Point Session Dosage Given: 2.67 Gy
Session Number: 3

## 2022-03-10 ENCOUNTER — Ambulatory Visit
Admission: RE | Admit: 2022-03-10 | Discharge: 2022-03-10 | Disposition: A | Discharge status: Home / Self Care | Payer: BC Managed Care – PPO | Source: Ambulatory Visit | Attending: Radiation Oncology | Admitting: Radiation Oncology

## 2022-03-10 ENCOUNTER — Other Ambulatory Visit: Payer: Self-pay

## 2022-03-10 DIAGNOSIS — Z510 Encounter for antineoplastic radiation therapy: Secondary | ICD-10-CM | POA: Diagnosis not present

## 2022-03-10 LAB — RAD ONC ARIA SESSION SUMMARY
Course Elapsed Days: 5
Plan Fractions Treated to Date: 4
Plan Prescribed Dose Per Fraction: 2.67 Gy
Plan Total Fractions Prescribed: 15
Plan Total Prescribed Dose: 40.05 Gy
Reference Point Dosage Given to Date: 10.68 Gy
Reference Point Session Dosage Given: 2.67 Gy
Session Number: 4

## 2022-03-10 MED ORDER — ALRA NON-METALLIC DEODORANT (RAD-ONC): 1.0000 | Freq: Once | TOPICAL | Status: DC

## 2022-03-10 MED ORDER — RADIAPLEXRX EX GEL: Freq: Two times a day (BID) | CUTANEOUS | Status: DC

## 2022-03-10 NOTE — Progress Notes (Signed)
Pt here for patient teaching.  Pt given Radiation and You booklet, skin care instructions, Alra deodorant, and Radiaplex gel.  Reviewed areas of pertinence such as fatigue, hair loss, skin changes, breast tenderness, and breast swelling . Pt able to give teach back of to pat skin,apply Radiaplex bid, avoid applying anything to skin within 4 hours of treatment, avoid wearing an under wire bra, and to use an electric razor if they must shave. Pt verbalizes understanding of information given and will contact nursing with any questions or concerns.     [email or web address]/treatmentinformation/whattoexpect/index      

## 2022-03-11 ENCOUNTER — Ambulatory Visit
Admission: RE | Admit: 2022-03-11 | Discharge: 2022-03-11 | Disposition: A | Discharge status: Home / Self Care | Payer: BC Managed Care – PPO | Source: Ambulatory Visit | Attending: Radiation Oncology | Admitting: Radiation Oncology

## 2022-03-11 ENCOUNTER — Other Ambulatory Visit: Payer: Self-pay

## 2022-03-11 DIAGNOSIS — Z510 Encounter for antineoplastic radiation therapy: Secondary | ICD-10-CM | POA: Diagnosis not present

## 2022-03-11 LAB — RAD ONC ARIA SESSION SUMMARY
Course Elapsed Days: 6
Plan Fractions Treated to Date: 5
Plan Prescribed Dose Per Fraction: 2.67 Gy
Plan Total Fractions Prescribed: 15
Plan Total Prescribed Dose: 40.05 Gy
Reference Point Dosage Given to Date: 13.35 Gy
Reference Point Session Dosage Given: 2.67 Gy
Session Number: 5

## 2022-03-12 ENCOUNTER — Inpatient Hospital Stay: Payer: BC Managed Care – PPO | Attending: Hematology and Oncology | Admitting: Genetic Counselor

## 2022-03-12 ENCOUNTER — Ambulatory Visit
Admission: RE | Admit: 2022-03-12 | Discharge: 2022-03-12 | Disposition: A | Discharge status: Home / Self Care | Payer: BC Managed Care – PPO | Source: Ambulatory Visit | Attending: Radiation Oncology | Admitting: Radiation Oncology

## 2022-03-12 ENCOUNTER — Encounter: Payer: Self-pay | Admitting: Genetic Counselor

## 2022-03-12 ENCOUNTER — Other Ambulatory Visit: Payer: Self-pay

## 2022-03-12 ENCOUNTER — Other Ambulatory Visit: Payer: Self-pay | Admitting: Genetic Counselor

## 2022-03-12 ENCOUNTER — Inpatient Hospital Stay: Payer: BC Managed Care – PPO

## 2022-03-12 DIAGNOSIS — C50311 Malignant neoplasm of lower-inner quadrant of right female breast: Secondary | ICD-10-CM

## 2022-03-12 DIAGNOSIS — Z806 Family history of leukemia: Secondary | ICD-10-CM | POA: Diagnosis not present

## 2022-03-12 DIAGNOSIS — Z801 Family history of malignant neoplasm of trachea, bronchus and lung: Secondary | ICD-10-CM | POA: Diagnosis not present

## 2022-03-12 DIAGNOSIS — Z170 Estrogen receptor positive status [ER+]: Secondary | ICD-10-CM

## 2022-03-12 DIAGNOSIS — Z510 Encounter for antineoplastic radiation therapy: Secondary | ICD-10-CM | POA: Diagnosis not present

## 2022-03-12 DIAGNOSIS — Z807 Family history of other malignant neoplasms of lymphoid, hematopoietic and related tissues: Secondary | ICD-10-CM

## 2022-03-12 LAB — RAD ONC ARIA SESSION SUMMARY
Course Elapsed Days: 7
Plan Fractions Treated to Date: 6
Plan Prescribed Dose Per Fraction: 2.67 Gy
Plan Total Fractions Prescribed: 15
Plan Total Prescribed Dose: 40.05 Gy
Reference Point Dosage Given to Date: 16.02 Gy
Reference Point Session Dosage Given: 2.67 Gy
Session Number: 6

## 2022-03-12 NOTE — Progress Notes (Signed)
REFERRING PROVIDER: Benay Pike, MD [place],  [place] [address]  PRIMARY PROVIDER:  Francis Gaines, FNP  PRIMARY REASON FOR VISIT:  1. Malignant neoplasm of lower-inner quadrant of right breast of female, estrogen receptor positive ([place])      HISTORY OF PRESENT ILLNESS:   Barbara [name], a 51 y.o. female, was seen for a [place] cancer genetics consultation at the request of Dr. Chryl Heck due to a personal and family history of cancer.  Barbara [name] presents to clinic today to discuss the possibility of a hereditary predisposition to cancer, genetic testing, and to further clarify her future cancer risks, as well as potential cancer risks for family members.   In July 2023, at the age of 39, Barbara [name] was diagnosed with invasive ductal carcinoma of the right breast. The treatment plan included lumpectomy and radiation.    CANCER HISTORY:  Oncology History  Breast cancer of lower-inner quadrant of right female breast ([place])  01/20/2022 Initial Diagnosis   Breast cancer of lower-inner quadrant of right female breast ([place])   01/20/2022 Cancer Staging   Staging form: Breast, AJCC 8th Edition - Clinical stage from 01/20/2022: Stage IB (cT2, cN0, cM0, G2, ER+, PR+, HER2-) - Signed by Eppie Gibson, MD on 01/20/2022 Stage prefix: Initial diagnosis Histologic grading system: 3 grade system    Definitive Surgery   Status post right breast lumpectomy and sentinel lymph node biopsy with final pathology showing invasive lobular carcinoma, grade 2 and an incidental well-differentiated tubular carcinoma, grade 1.  Lobular carcinoma measures 3.3 x 2.5 x 1.5 cm and the tubular carcinoma measures 1.2 mm in greatest dimension.  Invasive carcinoma transected and inferior margin, 0.5 mm, extensive lobular carcinoma in situ and atypical lobular hyperplasia.  Sentinel lymph nodes negative, no evidence of carcinoma.   Final pathologic staging was PT2N0.    Oncotype testing   Oncotype DX of 18,  distant recurrence risk at 9 years of 5% and chemotherapy benefit less than 1%      RISK FACTORS:  Menarche was at age 59.  First live birth at age 22.  Ovaries intact: yes.  Hysterectomy: no.  Menopausal status: postmenopausal.  HRT use: 0 years. Colonoscopy: no;  nl cologuard . Mammogram within the last year: yes. Number of breast biopsies: 1.  Past Medical History:  Diagnosis Date   Hypertension     Past Surgical History:  Procedure Laterality Date   BREAST LUMPECTOMY WITH RADIOACTIVE SEED AND SENTINEL LYMPH NODE BIOPSY Right 01/21/2022   Procedure: RIGHT BREAST SEED LOCALIZED LUMPECTOMY AND SENTINEL LYMPH NODE BIOPSY;  Surgeon: Stark Klein, MD;  Location: [facility];  Service: General;  Laterality: Right;  GEN & PEC BLOCK    Social History   Socioeconomic History   Marital status: Married    Spouse name: Willie Loy   Number of children: 3   Years of education: Not on file   Highest education level: Not on file  Occupational History   Not on file  Tobacco Use   Smoking status: Never   Smokeless tobacco: Never  Vaping Use   Vaping Use: Never used  Substance and Sexual Activity   Alcohol use: Never   Drug use: Never   Sexual activity: Yes  Other Topics Concern   Not on file  Social History Narrative   Not on file   Social Determinants of Health   Financial Resource Strain: Low Risk  (02/03/2022)   Overall Financial Resource Strain (CARDIA)  Difficulty of Paying Living Expenses: Not very hard  Food Insecurity: No Food Insecurity (02/03/2022)   Hunger Vital Sign    Worried About Running Out of Food in the Last Year: Never true    Ran Out of Food in the Last Year: Never true  Transportation Needs: No Transportation Needs (02/03/2022)   PRAPARE - Hydrologist (Medical): No    Lack of Transportation (Non-Medical): No  Physical Activity: Not on file  Stress: Not on file  Social Connections: Not on file     FAMILY  HISTORY:  We obtained a detailed, 4-generation family history.  Significant diagnoses are listed below: Family History  Problem Relation Age of Onset   Multiple myeloma Mother    Lung cancer Father        d. 60   Lung cancer Maternal Uncle    Lung cancer Paternal Uncle    Leukemia Maternal Grandfather      The patient has a son and two daughters who are cancer free.  She has tow brothers who are cancer free.  Her mother is living and her father is deceased.  The patient's mother has myeloma.  She has two sisters and a brother with lung cancer.  The maternal grandparents are deceased.  The grandfather had leukemia.  The patient's father had lung cancer and died at 19. He had two brothers, one who had lung cancer.  The paternal grandparents are deceased.  Barbara [name] is unaware of previous family history of genetic testing for hereditary cancer risks. Patient's maternal ancestors are of [place] and [place] descent, and paternal ancestors are of [place] descent. There is no reported Ashkenazi Jewish ancestry. There is no known consanguinity.  GENETIC COUNSELING ASSESSMENT: Barbara [name] is a 51 y.o. female with a personal and family history of cancer which is somewhat suggestive of a sporadic cancer given her age and lack of family history. We, therefore, discussed and recommended the following at today's visit.   DISCUSSION: We discussed that, in general, most cancer is not inherited in families, but instead is sporadic or familial. Sporadic cancers occur by chance and typically happen at older ages (>50 years) as this type of cancer is caused by genetic changes acquired during an individual's lifetime. Some families have more cancers than would be expected by chance; however, the ages or types of cancer are not consistent with a known genetic mutation or known genetic mutations have been ruled out. This type of familial cancer is thought to be due to a combination of multiple genetic, environmental,  hormonal, and lifestyle factors. While this combination of factors likely increases the risk of cancer, the exact source of this risk is not currently identifiable or testable.  We discussed that 5 - 10% of breast cancer is hereditary, with most cases associated with BRCA mutations.  There are other genes that can be associated with hereditary breast cancer syndromes.  These include ATM, CHEK2 and PALB2.  We discussed that testing is beneficial for several reasons including knowing how to follow individuals after completing their treatment, identifying whether potential treatment options such as PARP inhibitors would be beneficial, and understand if other family members could be at risk for cancer and allow them to undergo genetic testing.   We discussed with Barbara [name] that the personal and family history does not meet insurance or NCCN criteria for genetic testing and, therefore, is not highly consistent with a familial hereditary cancer syndrome.  We feel she is at  low risk to harbor a gene mutation associated with such a condition. Despite that she not meet criteria, she may still undergo genetic testing and have an out of pocket cost. We discussed that if her out of pocket cost for testing would be around $100, the laboratory will call and confirm whether she wants to proceed with testing.      PLAN:  Barbara [name] did not wish to pursue genetic testing at today's visit. We understand this decision and remain available to coordinate genetic testing at any time in the future. We, therefore, recommend Barbara [name] continue to follow the cancer screening guidelines given by her primary healthcare provider.  Lastly, we encouraged Barbara [name] to remain in contact with cancer genetics annually so that we can continuously update the family history and inform her of any changes in cancer genetics and testing that may be of benefit for this family.   Barbara [name]'s questions were answered to her satisfaction today.  Our contact information was provided should additional questions or concerns arise. Thank you for the referral and allowing [place] to share in the care of your patient.   Ario Mcdiarmid P. Florene Glen, [place], [facility] Licensed, Insurance risk surveyor [email or web address] phone: [phone]  The patient was seen for a total of 35 minutes in face-to-face genetic counseling.  The patient was seen alone.  Drs. Michell Heinrich, and/or [place] were available for questions, if needed..    _______________________________________________________________________ For Office Staff:  Number of people involved in session: 1 Was an Intern/ student involved with case: yes Carloyn Manner

## 2022-03-13 ENCOUNTER — Ambulatory Visit: Admission: RE | Admit: 2022-03-13 | Payer: BC Managed Care – PPO | Source: Ambulatory Visit

## 2022-03-13 ENCOUNTER — Other Ambulatory Visit: Payer: Self-pay

## 2022-03-13 DIAGNOSIS — Z510 Encounter for antineoplastic radiation therapy: Secondary | ICD-10-CM | POA: Diagnosis not present

## 2022-03-13 LAB — RAD ONC ARIA SESSION SUMMARY
Course Elapsed Days: 8
Plan Fractions Treated to Date: 7
Plan Prescribed Dose Per Fraction: 2.67 Gy
Plan Total Fractions Prescribed: 15
Plan Total Prescribed Dose: 40.05 Gy
Reference Point Dosage Given to Date: 18.69 Gy
Reference Point Session Dosage Given: 2.67 Gy
Session Number: 7

## 2022-03-14 ENCOUNTER — Other Ambulatory Visit: Payer: Self-pay

## 2022-03-14 ENCOUNTER — Ambulatory Visit
Admission: RE | Admit: 2022-03-14 | Discharge: 2022-03-14 | Disposition: A | Discharge status: Home / Self Care | Payer: BC Managed Care – PPO | Source: Ambulatory Visit | Attending: Radiation Oncology | Admitting: Radiation Oncology

## 2022-03-14 DIAGNOSIS — Z510 Encounter for antineoplastic radiation therapy: Secondary | ICD-10-CM | POA: Diagnosis not present

## 2022-03-14 LAB — RAD ONC ARIA SESSION SUMMARY
Course Elapsed Days: 9
Plan Fractions Treated to Date: 8
Plan Prescribed Dose Per Fraction: 2.67 Gy
Plan Total Fractions Prescribed: 15
Plan Total Prescribed Dose: 40.05 Gy
Reference Point Dosage Given to Date: 21.36 Gy
Reference Point Session Dosage Given: 2.67 Gy
Session Number: 8

## 2022-03-17 ENCOUNTER — Ambulatory Visit: Payer: BC Managed Care – PPO

## 2022-03-17 ENCOUNTER — Other Ambulatory Visit: Payer: Self-pay

## 2022-03-17 ENCOUNTER — Ambulatory Visit
Admission: RE | Admit: 2022-03-17 | Discharge: 2022-03-17 | Disposition: A | Discharge status: Home / Self Care | Payer: BC Managed Care – PPO | Source: Ambulatory Visit | Attending: Radiation Oncology | Admitting: Radiation Oncology

## 2022-03-17 DIAGNOSIS — Z510 Encounter for antineoplastic radiation therapy: Secondary | ICD-10-CM | POA: Diagnosis not present

## 2022-03-17 LAB — RAD ONC ARIA SESSION SUMMARY
Course Elapsed Days: 12
Plan Fractions Treated to Date: 9
Plan Prescribed Dose Per Fraction: 2.67 Gy
Plan Total Fractions Prescribed: 15
Plan Total Prescribed Dose: 40.05 Gy
Reference Point Dosage Given to Date: 24.03 Gy
Reference Point Session Dosage Given: 2.67 Gy
Session Number: 9

## 2022-03-18 ENCOUNTER — Other Ambulatory Visit: Payer: Self-pay

## 2022-03-18 ENCOUNTER — Ambulatory Visit
Admission: RE | Admit: 2022-03-18 | Discharge: 2022-03-18 | Disposition: A | Discharge status: Home / Self Care | Payer: BC Managed Care – PPO | Source: Ambulatory Visit | Attending: Radiation Oncology | Admitting: Radiation Oncology

## 2022-03-18 DIAGNOSIS — Z510 Encounter for antineoplastic radiation therapy: Secondary | ICD-10-CM | POA: Diagnosis not present

## 2022-03-18 LAB — RAD ONC ARIA SESSION SUMMARY
Course Elapsed Days: 13
Plan Fractions Treated to Date: 10
Plan Prescribed Dose Per Fraction: 2.67 Gy
Plan Total Fractions Prescribed: 15
Plan Total Prescribed Dose: 40.05 Gy
Reference Point Dosage Given to Date: 26.7 Gy
Reference Point Session Dosage Given: 2.67 Gy
Session Number: 10

## 2022-03-19 ENCOUNTER — Other Ambulatory Visit: Payer: Self-pay

## 2022-03-19 ENCOUNTER — Ambulatory Visit
Admission: RE | Admit: 2022-03-19 | Discharge: 2022-03-19 | Disposition: A | Discharge status: Home / Self Care | Payer: BC Managed Care – PPO | Source: Ambulatory Visit | Attending: Radiation Oncology | Admitting: Radiation Oncology

## 2022-03-19 DIAGNOSIS — Z510 Encounter for antineoplastic radiation therapy: Secondary | ICD-10-CM | POA: Diagnosis not present

## 2022-03-19 LAB — RAD ONC ARIA SESSION SUMMARY
Course Elapsed Days: 14
Plan Fractions Treated to Date: 11
Plan Prescribed Dose Per Fraction: 2.67 Gy
Plan Total Fractions Prescribed: 15
Plan Total Prescribed Dose: 40.05 Gy
Reference Point Dosage Given to Date: 29.37 Gy
Reference Point Session Dosage Given: 2.67 Gy
Session Number: 11

## 2022-03-20 ENCOUNTER — Other Ambulatory Visit: Payer: Self-pay

## 2022-03-20 ENCOUNTER — Ambulatory Visit
Admission: RE | Admit: 2022-03-20 | Discharge: 2022-03-20 | Disposition: A | Discharge status: Home / Self Care | Payer: BC Managed Care – PPO | Source: Ambulatory Visit | Attending: Radiation Oncology | Admitting: Radiation Oncology

## 2022-03-20 DIAGNOSIS — Z510 Encounter for antineoplastic radiation therapy: Secondary | ICD-10-CM | POA: Diagnosis not present

## 2022-03-20 LAB — RAD ONC ARIA SESSION SUMMARY
Course Elapsed Days: 15
Plan Fractions Treated to Date: 12
Plan Prescribed Dose Per Fraction: 2.67 Gy
Plan Total Fractions Prescribed: 15
Plan Total Prescribed Dose: 40.05 Gy
Reference Point Dosage Given to Date: 32.04 Gy
Reference Point Session Dosage Given: 2.67 Gy
Session Number: 12

## 2022-03-21 ENCOUNTER — Other Ambulatory Visit: Payer: Self-pay

## 2022-03-21 ENCOUNTER — Ambulatory Visit
Admission: RE | Admit: 2022-03-21 | Discharge: 2022-03-21 | Disposition: A | Discharge status: Home / Self Care | Payer: BC Managed Care – PPO | Source: Ambulatory Visit | Attending: Radiation Oncology | Admitting: Radiation Oncology

## 2022-03-21 DIAGNOSIS — Z510 Encounter for antineoplastic radiation therapy: Secondary | ICD-10-CM | POA: Diagnosis not present

## 2022-03-21 LAB — RAD ONC ARIA SESSION SUMMARY
Course Elapsed Days: 16
Plan Fractions Treated to Date: 13
Plan Prescribed Dose Per Fraction: 2.67 Gy
Plan Total Fractions Prescribed: 15
Plan Total Prescribed Dose: 40.05 Gy
Reference Point Dosage Given to Date: 34.71 Gy
Reference Point Session Dosage Given: 2.67 Gy
Session Number: 13

## 2022-03-24 ENCOUNTER — Ambulatory Visit
Admission: RE | Admit: 2022-03-24 | Discharge: 2022-03-24 | Disposition: A | Discharge status: Home / Self Care | Payer: BC Managed Care – PPO | Source: Ambulatory Visit | Attending: Radiation Oncology | Admitting: Radiation Oncology

## 2022-03-24 ENCOUNTER — Other Ambulatory Visit: Payer: Self-pay

## 2022-03-24 ENCOUNTER — Ambulatory Visit: Payer: BC Managed Care – PPO

## 2022-03-24 ENCOUNTER — Ambulatory Visit: Payer: BC Managed Care – PPO | Admitting: Radiation Oncology

## 2022-03-24 DIAGNOSIS — Z510 Encounter for antineoplastic radiation therapy: Secondary | ICD-10-CM | POA: Diagnosis not present

## 2022-03-24 LAB — RAD ONC ARIA SESSION SUMMARY
Course Elapsed Days: 19
Plan Fractions Treated to Date: 14
Plan Prescribed Dose Per Fraction: 2.67 Gy
Plan Total Fractions Prescribed: 15
Plan Total Prescribed Dose: 40.05 Gy
Reference Point Dosage Given to Date: 37.38 Gy
Reference Point Session Dosage Given: 2.67 Gy
Session Number: 14

## 2022-03-25 ENCOUNTER — Other Ambulatory Visit: Payer: Self-pay

## 2022-03-25 ENCOUNTER — Ambulatory Visit
Admission: RE | Admit: 2022-03-25 | Discharge: 2022-03-25 | Disposition: A | Discharge status: Home / Self Care | Payer: BC Managed Care – PPO | Source: Ambulatory Visit | Attending: Radiation Oncology | Admitting: Radiation Oncology

## 2022-03-25 DIAGNOSIS — Z510 Encounter for antineoplastic radiation therapy: Secondary | ICD-10-CM | POA: Diagnosis not present

## 2022-03-25 LAB — RAD ONC ARIA SESSION SUMMARY
Course Elapsed Days: 20
Plan Fractions Treated to Date: 15
Plan Prescribed Dose Per Fraction: 2.67 Gy
Plan Total Fractions Prescribed: 15
Plan Total Prescribed Dose: 40.05 Gy
Reference Point Dosage Given to Date: 40.05 Gy
Reference Point Session Dosage Given: 2.67 Gy
Session Number: 15

## 2022-03-26 ENCOUNTER — Ambulatory Visit
Admission: RE | Admit: 2022-03-26 | Discharge: 2022-03-26 | Disposition: A | Discharge status: Home / Self Care | Payer: BC Managed Care – PPO | Source: Ambulatory Visit | Attending: Radiation Oncology | Admitting: Radiation Oncology

## 2022-03-26 ENCOUNTER — Other Ambulatory Visit: Payer: Self-pay

## 2022-03-26 DIAGNOSIS — Z510 Encounter for antineoplastic radiation therapy: Secondary | ICD-10-CM | POA: Diagnosis not present

## 2022-03-26 LAB — RAD ONC ARIA SESSION SUMMARY
Course Elapsed Days: 21
Plan Fractions Treated to Date: 1
Plan Prescribed Dose Per Fraction: 2 Gy
Plan Total Fractions Prescribed: 5
Plan Total Prescribed Dose: 10 Gy
Reference Point Dosage Given to Date: 2 Gy
Reference Point Session Dosage Given: 2 Gy
Session Number: 16

## 2022-03-27 ENCOUNTER — Ambulatory Visit
Admission: RE | Admit: 2022-03-27 | Discharge: 2022-03-27 | Disposition: A | Discharge status: Home / Self Care | Payer: BC Managed Care – PPO | Source: Ambulatory Visit | Attending: Radiation Oncology | Admitting: Radiation Oncology

## 2022-03-27 ENCOUNTER — Other Ambulatory Visit: Payer: Self-pay

## 2022-03-27 DIAGNOSIS — Z510 Encounter for antineoplastic radiation therapy: Secondary | ICD-10-CM | POA: Diagnosis not present

## 2022-03-27 LAB — RAD ONC ARIA SESSION SUMMARY
Course Elapsed Days: 22
Plan Fractions Treated to Date: 2
Plan Prescribed Dose Per Fraction: 2 Gy
Plan Total Fractions Prescribed: 5
Plan Total Prescribed Dose: 10 Gy
Reference Point Dosage Given to Date: 4 Gy
Reference Point Session Dosage Given: 2 Gy
Session Number: 17

## 2022-03-28 ENCOUNTER — Other Ambulatory Visit: Payer: Self-pay

## 2022-03-28 ENCOUNTER — Ambulatory Visit
Admission: RE | Admit: 2022-03-28 | Discharge: 2022-03-28 | Disposition: A | Discharge status: Home / Self Care | Payer: BC Managed Care – PPO | Source: Ambulatory Visit | Attending: Radiation Oncology | Admitting: Radiation Oncology

## 2022-03-28 DIAGNOSIS — Z510 Encounter for antineoplastic radiation therapy: Secondary | ICD-10-CM | POA: Diagnosis not present

## 2022-03-28 LAB — RAD ONC ARIA SESSION SUMMARY
Course Elapsed Days: 23
Plan Fractions Treated to Date: 3
Plan Prescribed Dose Per Fraction: 2 Gy
Plan Total Fractions Prescribed: 5
Plan Total Prescribed Dose: 10 Gy
Reference Point Dosage Given to Date: 6 Gy
Reference Point Session Dosage Given: 2 Gy
Session Number: 18

## 2022-03-31 ENCOUNTER — Ambulatory Visit
Admission: RE | Admit: 2022-03-31 | Discharge: 2022-03-31 | Disposition: A | Discharge status: Home / Self Care | Payer: BC Managed Care – PPO | Source: Ambulatory Visit | Attending: Radiation Oncology | Admitting: Radiation Oncology

## 2022-03-31 ENCOUNTER — Other Ambulatory Visit: Payer: Self-pay

## 2022-03-31 DIAGNOSIS — C50311 Malignant neoplasm of lower-inner quadrant of right female breast: Secondary | ICD-10-CM | POA: Insufficient documentation

## 2022-03-31 DIAGNOSIS — Z170 Estrogen receptor positive status [ER+]: Secondary | ICD-10-CM | POA: Diagnosis present

## 2022-03-31 LAB — RAD ONC ARIA SESSION SUMMARY
Course Elapsed Days: 26
Plan Fractions Treated to Date: 4
Plan Prescribed Dose Per Fraction: 2 Gy
Plan Total Fractions Prescribed: 5
Plan Total Prescribed Dose: 10 Gy
Reference Point Dosage Given to Date: 8 Gy
Reference Point Session Dosage Given: 2 Gy
Session Number: 19

## 2022-03-31 MED ORDER — RADIAPLEXRX EX GEL: Freq: Once | CUTANEOUS | Status: AC

## 2022-04-01 ENCOUNTER — Other Ambulatory Visit: Payer: Self-pay

## 2022-04-01 ENCOUNTER — Ambulatory Visit
Admission: RE | Admit: 2022-04-01 | Discharge: 2022-04-01 | Disposition: A | Discharge status: Home / Self Care | Payer: BC Managed Care – PPO | Source: Ambulatory Visit | Attending: Radiation Oncology | Admitting: Radiation Oncology

## 2022-04-01 ENCOUNTER — Encounter: Payer: Self-pay | Admitting: *Deleted

## 2022-04-01 ENCOUNTER — Encounter: Payer: Self-pay | Admitting: Radiation Oncology

## 2022-04-01 DIAGNOSIS — C50311 Malignant neoplasm of lower-inner quadrant of right female breast: Secondary | ICD-10-CM | POA: Diagnosis not present

## 2022-04-01 LAB — RAD ONC ARIA SESSION SUMMARY
Course Elapsed Days: 27
Plan Fractions Treated to Date: 5
Plan Prescribed Dose Per Fraction: 2 Gy
Plan Total Fractions Prescribed: 5
Plan Total Prescribed Dose: 10 Gy
Reference Point Dosage Given to Date: 10 Gy
Reference Point Session Dosage Given: 2 Gy
Session Number: 20

## 2022-04-07 NOTE — Progress Notes (Signed)
Patient Name: Barbara [name] MRN: [id number] DOB: 1971-05-31 Referring Physician: Francis Gaines Date of Service: 04/01/2022 [facility], [place]                                                        End Of Treatment Note  Diagnoses: C50.311-Malignant neoplasm of lower-inner quadrant of right female breast  Cancer Staging:  Cancer Staging  Breast cancer of lower-inner quadrant of right female breast [facility]) Staging form: Breast, AJCC 8th Edition - Clinical stage from 01/20/2022: Stage IB (cT2, cN0, cM0, G2, ER+, PR+, HER2-) - Signed by Eppie Gibson, MD on 01/20/2022 Stage prefix: Initial diagnosis Histologic grading system: 3 grade system  pT2, pN0  Intent: Curative  Radiation Treatment Dates: 03/05/2022 through 04/01/2022 Site Technique Total Dose (Gy) Dose per Fx (Gy) Completed Fx Beam Energies  Breast, Right: Breast_R 3D 40.05/40.05 2.67 15/15 10X  Breast, Right: Breast_R_Bst 3D 10/10 2 5/5 6X, 10X   Narrative: The patient tolerated radiation therapy relatively well.   Plan: The patient will follow-up with radiation oncology in 52mo.  -----------------------------------  Eppie Gibson, MD

## 2022-04-11 ENCOUNTER — Encounter: Payer: Self-pay | Admitting: Hematology and Oncology

## 2022-04-11 ENCOUNTER — Inpatient Hospital Stay: Payer: BC Managed Care – PPO | Attending: Hematology and Oncology | Admitting: Hematology and Oncology

## 2022-04-11 ENCOUNTER — Telehealth: Payer: Self-pay | Admitting: Adult Health

## 2022-04-11 DIAGNOSIS — Z170 Estrogen receptor positive status [ER+]: Secondary | ICD-10-CM | POA: Insufficient documentation

## 2022-04-11 DIAGNOSIS — Z79811 Long term (current) use of aromatase inhibitors: Secondary | ICD-10-CM | POA: Insufficient documentation

## 2022-04-11 DIAGNOSIS — I10 Essential (primary) hypertension: Secondary | ICD-10-CM | POA: Diagnosis not present

## 2022-04-11 DIAGNOSIS — Z807 Family history of other malignant neoplasms of lymphoid, hematopoietic and related tissues: Secondary | ICD-10-CM | POA: Diagnosis not present

## 2022-04-11 DIAGNOSIS — C50311 Malignant neoplasm of lower-inner quadrant of right female breast: Secondary | ICD-10-CM | POA: Insufficient documentation

## 2022-04-11 DIAGNOSIS — Z801 Family history of malignant neoplasm of trachea, bronchus and lung: Secondary | ICD-10-CM | POA: Diagnosis not present

## 2022-04-11 DIAGNOSIS — Z806 Family history of leukemia: Secondary | ICD-10-CM | POA: Diagnosis not present

## 2022-04-11 DIAGNOSIS — Z923 Personal history of irradiation: Secondary | ICD-10-CM | POA: Insufficient documentation

## 2022-04-11 MED ORDER — ANASTROZOLE 1 MG PO TABS: 1.0000 mg | ORAL_TABLET | Freq: Every day | ORAL | 3 refills | Status: DC

## 2022-04-11 NOTE — Progress Notes (Signed)
[facility] CONSULT NOTE  Patient Care Team: Pacifico, Toni Arthurs, FNP as PCP - General (Family Medicine) Mauro Kaufmann, RN as Oncology Nurse Navigator Rockwell Germany, RN as Oncology Nurse Navigator  CHIEF COMPLAINTS/PURPOSE OF CONSULTATION:     HISTORY OF PRESENTING ILLNESS:  Barbara [name] 51 y.o. female is here because of recent diagnosis of right breast cancer  I reviewed her records extensively and collaborated the history with the patient.  SUMMARY OF ONCOLOGIC HISTORY: Oncology History  Breast cancer of lower-inner quadrant of right female breast ([facility])  01/20/2022 Initial Diagnosis   Breast cancer of lower-inner quadrant of right female breast ([facility])   01/20/2022 Cancer Staging   Staging form: Breast, AJCC 8th Edition - Clinical stage from 01/20/2022: Stage IB (cT2, cN0, cM0, G2, ER+, PR+, HER2-) - Signed by Eppie Gibson, MD on 01/20/2022 Stage prefix: Initial diagnosis Histologic grading system: 3 grade system    Definitive Surgery   Status post right breast lumpectomy and sentinel lymph node biopsy with final pathology showing invasive lobular carcinoma, grade 2 and an incidental well-differentiated tubular carcinoma, grade 1.  Lobular carcinoma measures 3.3 x 2.5 x 1.5 cm and the tubular carcinoma measures 1.2 mm in greatest dimension.  Invasive carcinoma transected and inferior margin, 0.5 mm, extensive lobular carcinoma in situ and atypical lobular hyperplasia.  Sentinel lymph nodes negative, no evidence of carcinoma.   Final pathologic staging was PT2N0.    Oncotype testing   Oncotype DX of 18, distant recurrence risk at 9 years of 5% and chemotherapy benefit less than 1%     Interval history  Barbara [name] is here for follow-up after radiation.  She has done really well except for some skin changes.  She feels a bit fatigued today but otherwise feels well overall.  She is ready to start antiestrogen therapy.  MEDICAL HISTORY:  Past Medical History:   Diagnosis Date   Hypertension     SURGICAL HISTORY: Past Surgical History:  Procedure Laterality Date   BREAST LUMPECTOMY WITH RADIOACTIVE SEED AND SENTINEL LYMPH NODE BIOPSY Right 01/21/2022   Procedure: RIGHT BREAST SEED LOCALIZED LUMPECTOMY AND SENTINEL LYMPH NODE BIOPSY;  Surgeon: Stark Klein, MD;  Location: [facility];  Service: General;  Laterality: Right;  GEN & PEC BLOCK    SOCIAL HISTORY: Social History   Socioeconomic History   Marital status: Married    Spouse name: [name]   Number of children: 3   Years of education: Not on file   Highest education level: Not on file  Occupational History   Not on file  Tobacco Use   Smoking status: Never   Smokeless tobacco: Never  Vaping Use   Vaping Use: Never used  Substance and Sexual Activity   Alcohol use: Never   Drug use: Never   Sexual activity: Yes  Other Topics Concern   Not on file  Social History Narrative   Not on file   Social Determinants of Health   Financial Resource Strain: Low Risk  (02/03/2022)   Overall Financial Resource Strain (CARDIA)    Difficulty of Paying Living Expenses: Not very hard  Food Insecurity: No Food Insecurity (02/03/2022)   Hunger Vital Sign    Worried About Running Out of Food in the Last Year: Never true    Ran Out of Food in the Last Year: Never true  Transportation Needs: No Transportation Needs (02/03/2022)   PRAPARE - Hydrologist (Medical): No  Lack of Transportation (Non-Medical): No  Physical Activity: Not on file  Stress: Not on file  Social Connections: Not on file  Intimate Partner Violence: Not on file    FAMILY HISTORY: Family History  Problem Relation Age of Onset   Multiple myeloma Mother    Lung cancer Father        d. 50   Lung cancer Maternal Uncle    Lung cancer Paternal Uncle    Leukemia Maternal Grandfather     ALLERGIES:  has No Known Allergies.  MEDICATIONS:  Current Outpatient Medications   Medication Sig Dispense Refill   anastrozole (ARIMIDEX) 1 MG tablet Take 1 tablet (1 mg total) by mouth daily. 90 tablet 3   losartan-hydrochlorothiazide (HYZAAR) 100-12.5 MG tablet Take 1 tablet by mouth daily.     Omega-3 Fatty Acids (FISH OIL) 1000 MG CAPS Take 2 capsules by mouth daily.     No current facility-administered medications for this visit.    REVIEW OF SYSTEMS:   Constitutional: Denies fevers, chills or abnormal night sweats Eyes: Denies blurriness of vision, double vision or watery eyes Ears, nose, mouth, throat, and face: Denies mucositis or sore throat Respiratory: Denies cough, dyspnea or wheezes Cardiovascular: Denies palpitation, chest discomfort or lower extremity swelling Gastrointestinal:  Denies nausea, heartburn or change in bowel habits Skin: Denies abnormal skin rashes Lymphatics: Denies new lymphadenopathy or easy bruising Neurological:Denies numbness, tingling or new weaknesses Behavioral/Psych: Mood is stable, no new changes  Breast: Denies any palpable lumps or discharge All other systems were reviewed with the patient and are negative.  PHYSICAL EXAMINATION: ECOG PERFORMANCE STATUS: 0 - Asymptomatic  Vitals:   04/11/22 0819  BP: 127/84  Pulse: 88  Resp: 16  Temp: 98.2 F (36.8 C)  SpO2: 98%    Filed Weights   04/11/22 0819  Weight: 210 lb 2 oz (95.3 kg)     GENERAL:alert, no distress and comfortable Breast: Right breast surgical scar and axillary scar appears to be healing well  LABORATORY DATA:  I have reviewed the data as listed Lab Results  Component Value Date   WBC 8.6 01/17/2022   HGB 15.3 (H) 01/17/2022   HCT 44.8 01/17/2022   MCV 88.7 01/17/2022   PLT 330 01/17/2022   Lab Results  Component Value Date   NA 140 01/17/2022   K 3.8 01/17/2022   CL 106 01/17/2022   CO2 26 01/17/2022    RADIOGRAPHIC STUDIES: I have personally reviewed the radiological reports and agreed with the findings in the report.  ASSESSMENT  AND PLAN:  Breast cancer of lower-inner quadrant of right female breast [facility]) This is a very pleasant 51 year old female patient with newly diagnosed right breast invasive lobular carcinoma, ER/PR strongly +90%, HER2 negative Ki-67 of 2%.  She is status post right breast lumpectomy and sentinel lymph node biopsy with final pathology confirming invasive lobular carcinoma grade 2 as well as an incidental well-differentiated tubular carcinoma, grade 1.  Lobular carcinoma measured 3.3 x 2.5 x 1.5 cm and the tubular carcinoma measured 1.2 mm in greatest extent.  Sentinel lymph nodes negative.  She is here to discuss adjuvant recommendations.    Oncotype Dx of 18, no benefit of chemotherapy.  Barbara [name] is now status post radiation.  She tolerated this very well except for skin changes.  She is now going to initiate antiestrogen therapy.  We have once again reviewed the available options tamoxifen versus aromatase inhibitors.  Her last menstrual cycle was more than 2 years ago.  She would like to try aromatase inhibitors.  I once again discussed adverse effects including but not limited to postmenopausal symptoms such as hot flashes, vaginal dryness, arthralgias, bone loss.  We will obtain a baseline bone density scan.  She will return to clinic in 3 months for survivorship visit in 6 months for follow-up   Total time spent 20 minutes including history, physical exam, review of records, counseling and coordination of care All questions were answered. The patient knows to call the clinic with any problems, questions or concerns.    Benay Pike, MD 04/11/22

## 2022-04-11 NOTE — Assessment & Plan Note (Addendum)
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  She is status post right breast lumpectomy and sentinel lymph node biopsy with final pathology confirming invasive lobular carcinoma grade 2 as well as an incidental well-differentiated tubular carcinoma, grade 1.  Lobular carcinoma measured 3.3 x 2.5 x 1.5 cm and the tubular carcinoma measured 1.2 mm in greatest extent.  Sentinel lymph nodes negative.  She is here to discuss adjuvant recommendations.    Oncotype Dx of 18, no benefit of chemotherapy.  Ms. Barbara [name] is now status post radiation.  She tolerated this very well except for skin changes.  She is now going to initiate antiestrogen therapy.  We have once again reviewed the available options tamoxifen versus aromatase inhibitors.  Her last menstrual cycle was more than 2 years ago.  She would like to try aromatase inhibitors.  I once again discussed adverse effects including but not limited to postmenopausal symptoms such as hot flashes, vaginal dryness, arthralgias, bone loss.  We will obtain a baseline bone density scan.  She will return to clinic in 3 months for survivorship visit in 6 months for follow-up

## 2022-04-11 NOTE — Telephone Encounter (Signed)
Scheduled follow-up appointment per 10/13 schedule message. Patient is aware.

## 2022-04-11 NOTE — Progress Notes (Signed)
[facility] CONSULT NOTE  Patient Care Team: Pacifico, Toni Arthurs, FNP as PCP - General (Family Medicine) Mauro Kaufmann, RN as Oncology Nurse Navigator Rockwell Germany, RN as Oncology Nurse Navigator  CHIEF COMPLAINTS/PURPOSE OF CONSULTATION:   Breast cancer status post surgery  HISTORY OF PRESENTING ILLNESS:  Barbara [name] 51 y.o. female is here because of recent diagnosis of right breast cancer  I reviewed her records extensively and collaborated the history with the patient.  SUMMARY OF ONCOLOGIC HISTORY: Oncology History  Breast cancer of lower-inner quadrant of right female breast ([place])  01/20/2022 Initial Diagnosis   Breast cancer of lower-inner quadrant of right female breast ([facility])   01/20/2022 Cancer Staging   Staging form: Breast, AJCC 8th Edition - Clinical stage from 01/20/2022: Stage IB (cT2, cN0, cM0, G2, ER+, PR+, HER2-) - Signed by Eppie Gibson, MD on 01/20/2022 Stage prefix: Initial diagnosis Histologic grading system: 3 grade system    Definitive Surgery   Status post right breast lumpectomy and sentinel lymph node biopsy with final pathology showing invasive lobular carcinoma, grade 2 and an incidental well-differentiated tubular carcinoma, grade 1.  Lobular carcinoma measures 3.3 x 2.5 x 1.5 cm and the tubular carcinoma measures 1.2 mm in greatest dimension.  Invasive carcinoma transected and inferior margin, 0.5 mm, extensive lobular carcinoma in situ and atypical lobular hyperplasia.  Sentinel lymph nodes negative, no evidence of carcinoma.   Final pathologic staging was PT2N0.    Oncotype testing   Oncotype DX of 18, distant recurrence risk at 9 years of 5% and chemotherapy benefit less than 1%     Interval history  She is here for follow-up.  She is doing quite well since surgery, healing well.  She will be meeting Dr. Isidore Moos for adjuvant radiation soon.  MEDICAL HISTORY:  Past Medical History:  Diagnosis Date   Hypertension     SURGICAL  HISTORY: Past Surgical History:  Procedure Laterality Date   BREAST LUMPECTOMY WITH RADIOACTIVE SEED AND SENTINEL LYMPH NODE BIOPSY Right 01/21/2022   Procedure: RIGHT BREAST SEED LOCALIZED LUMPECTOMY AND SENTINEL LYMPH NODE BIOPSY;  Surgeon: Stark Klein, MD;  Location: [facility];  Service: General;  Laterality: Right;  GEN & PEC BLOCK    SOCIAL HISTORY: Social History   Socioeconomic History   Marital status: Married    Spouse name: Anelis Hrivnak   Number of children: 3   Years of education: Not on file   Highest education level: Not on file  Occupational History   Not on file  Tobacco Use   Smoking status: Never   Smokeless tobacco: Never  Vaping Use   Vaping Use: Never used  Substance and Sexual Activity   Alcohol use: Never   Drug use: Never   Sexual activity: Yes  Other Topics Concern   Not on file  Social History Narrative   Not on file   Social Determinants of Health   Financial Resource Strain: Low Risk  (02/03/2022)   Overall Financial Resource Strain (CARDIA)    Difficulty of Paying Living Expenses: Not very hard  Food Insecurity: No Food Insecurity (02/03/2022)   Hunger Vital Sign    Worried About Running Out of Food in the Last Year: Never true    Ran Out of Food in the Last Year: Never true  Transportation Needs: No Transportation Needs (02/03/2022)   PRAPARE - Hydrologist (Medical): No    Lack of Transportation (Non-Medical): No  Physical Activity: Not  on file  Stress: Not on file  Social Connections: Not on file  Intimate Partner Violence: Not on file    FAMILY HISTORY: Family History  Problem Relation Age of Onset   Multiple myeloma Mother    Lung cancer Father        d. 62   Lung cancer Maternal Uncle    Lung cancer Paternal Uncle    Leukemia Maternal Grandfather     ALLERGIES:  has No Known Allergies.  MEDICATIONS:  Current Outpatient Medications  Medication Sig Dispense Refill    losartan-hydrochlorothiazide (HYZAAR) 100-12.5 MG tablet Take 1 tablet by mouth daily.     Omega-3 Fatty Acids (FISH OIL) 1000 MG CAPS Take 2 capsules by mouth daily.     No current facility-administered medications for this visit.    REVIEW OF SYSTEMS:   Constitutional: Denies fevers, chills or abnormal night sweats Eyes: Denies blurriness of vision, double vision or watery eyes Ears, nose, mouth, throat, and face: Denies mucositis or sore throat Respiratory: Denies cough, dyspnea or wheezes Cardiovascular: Denies palpitation, chest discomfort or lower extremity swelling Gastrointestinal:  Denies nausea, heartburn or change in bowel habits Skin: Denies abnormal skin rashes Lymphatics: Denies new lymphadenopathy or easy bruising Neurological:Denies numbness, tingling or new weaknesses Behavioral/Psych: Mood is stable, no new changes  Breast: Denies any palpable lumps or discharge All other systems were reviewed with the patient and are negative.  PHYSICAL EXAMINATION: ECOG PERFORMANCE STATUS: 0 - Asymptomatic  Vitals:   04/11/22 0819  BP: 127/84  Pulse: 88  Resp: 16  Temp: 98.2 F (36.8 C)  SpO2: 98%    Filed Weights   04/11/22 0819  Weight: 210 lb 2 oz (95.3 kg)     GENERAL:alert, no distress and comfortable Breast: Right breast surgical scar and axillary scar appears to be healing well  LABORATORY DATA:  I have reviewed the data as listed Lab Results  Component Value Date   WBC 8.6 01/17/2022   HGB 15.3 (H) 01/17/2022   HCT 44.8 01/17/2022   MCV 88.7 01/17/2022   PLT 330 01/17/2022   Lab Results  Component Value Date   NA 140 01/17/2022   K 3.8 01/17/2022   CL 106 01/17/2022   CO2 26 01/17/2022    RADIOGRAPHIC STUDIES: I have personally reviewed the radiological reports and agreed with the findings in the report.  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  She is status post right breast lumpectomy and sentinel lymph node biopsy with final pathology confirming invasive lobular carcinoma grade 2 as well as an incidental well-differentiated tubular carcinoma, grade 1.  Lobular carcinoma measured 3.3 x 2.5 x 1.5 cm and the tubular carcinoma measured 1.2 mm in greatest extent.  Sentinel lymph nodes negative.  She is here to discuss adjuvant recommendations.    Oncotype Dx of 18, no benefit of chemotherapy.  She will proceed with adjuvant radiation and follow with antiestrogen therapy.  I once again discussed about role of tamoxifen versus aromatase inhibitors.  We have discussed mechanism of action with each class and adverse effects with each class.  She will return to clinic in mid October to follow-up on antiestrogen therapy initiated on antiestrogen therapy.  At this time, she appears to be leaning towards aromatase inhibitors.  I have offered both a televisit as well  as an in person visit.  Patient would like to come back for in person visit.  Total time spent 20 minutes including history, physical exam, review of records, counseling and coordination of care All questions were answered. The patient knows to call the clinic with any problems, questions or concerns.    Benay Pike, MD 04/11/22

## 2022-04-30 ENCOUNTER — Telehealth: Payer: Self-pay

## 2022-04-30 NOTE — Telephone Encounter (Signed)
I called the patient today about her upcoming follow-up appointment in radiation oncology.   Given the state of the COVID-19 pandemic, concerning case numbers in our community, and guidance from [facility], I offered a phone assessment with the patient to determine if coming to the clinic was necessary. She accepted.  The patient denies any symptomatic concerns.  Specifically, they report good healing of their skin in the radiation fields.  Skin is intact. She did report mild intermittent pain in the affect breast area but this pain was not concerning for her. She also continues to have mild intermittent fatigue (though this is improving and interfering with her everyday life.   I recommended that she continue skin care by applying oil or lotion with vitamin E to the skin in the radiation fields, BID, for 2 more months.  Continue follow-up with medical oncology - follow-up is scheduled on 07-14-22 with Wilber Bihari.  I explained that yearly mammograms are important for patients with intact breast tissue, and physical exams are important after mastectomy for patients that cannot undergo mammography.  I encouraged her to call if she had further questions or concerns about her healing. Otherwise, she will follow-up PRN in radiation oncology. Patient is pleased with this plan, and we will cancel her upcoming follow-up to reduce the risk of COVID-19 transmission.

## 2022-05-02 ENCOUNTER — Inpatient Hospital Stay
Admission: RE | Admit: 2022-05-02 | Payer: BC Managed Care – PPO | Source: Ambulatory Visit | Admitting: Radiation Oncology

## 2022-07-14 ENCOUNTER — Inpatient Hospital Stay: Payer: BC Managed Care – PPO | Attending: Hematology and Oncology | Admitting: Adult Health

## 2022-07-14 ENCOUNTER — Encounter: Payer: Self-pay | Admitting: Adult Health

## 2022-07-14 VITALS — BP 146/85 | HR 117 | Temp 97.8°F | Resp 18 | Ht 66.0 in | Wt 206.0 lb

## 2022-07-14 DIAGNOSIS — I10 Essential (primary) hypertension: Secondary | ICD-10-CM | POA: Diagnosis not present

## 2022-07-14 DIAGNOSIS — Z801 Family history of malignant neoplasm of trachea, bronchus and lung: Secondary | ICD-10-CM | POA: Insufficient documentation

## 2022-07-14 DIAGNOSIS — Z806 Family history of leukemia: Secondary | ICD-10-CM | POA: Insufficient documentation

## 2022-07-14 DIAGNOSIS — Z79811 Long term (current) use of aromatase inhibitors: Secondary | ICD-10-CM | POA: Diagnosis not present

## 2022-07-14 DIAGNOSIS — C50311 Malignant neoplasm of lower-inner quadrant of right female breast: Secondary | ICD-10-CM | POA: Diagnosis present

## 2022-07-14 DIAGNOSIS — Z170 Estrogen receptor positive status [ER+]: Secondary | ICD-10-CM

## 2022-07-14 DIAGNOSIS — Z807 Family history of other malignant neoplasms of lymphoid, hematopoietic and related tissues: Secondary | ICD-10-CM | POA: Insufficient documentation

## 2022-07-14 NOTE — Progress Notes (Signed)
SURVIVORSHIP VISIT:   BRIEF ONCOLOGIC HISTORY:  Oncology History  Breast cancer of lower-inner quadrant of right female breast ([place])  01/20/2022 Initial Diagnosis   Breast cancer of lower-inner quadrant of right female breast ([place])   01/20/2022 Cancer Staging   Staging form: Breast, AJCC 8th Edition - Clinical stage from 01/20/2022: Stage IB (cT2, cN0, cM0, G2, ER+, PR+, HER2-) - Signed by Eppie Gibson, MD on 01/20/2022 Stage prefix: Initial diagnosis Histologic grading system: 3 grade system    Definitive Surgery   Status post right breast lumpectomy and sentinel lymph node biopsy with final pathology showing invasive lobular carcinoma, grade 2 and an incidental well-differentiated tubular carcinoma, grade 1.  Lobular carcinoma measures 3.3 x 2.5 x 1.5 cm and the tubular carcinoma measures 1.2 mm in greatest dimension.  Invasive carcinoma transected and inferior margin, 0.5 mm, extensive lobular carcinoma in situ and atypical lobular hyperplasia.  Sentinel lymph nodes negative, no evidence of carcinoma.   Final pathologic staging was PT2N0.    Oncotype testing   Oncotype DX of 18, distant recurrence risk at 9 years of 5% and chemotherapy benefit less than 1%   03/05/2022 - 04/01/2022 Radiation Therapy   Site Technique Total Dose (Gy) Dose per Fx (Gy) Completed Fx Beam Energies  Breast, Right: Breast_R 3D 40.05/40.05 2.67 15/15 10X  Breast, Right: Breast_R_Bst 3D 10/10 2 5/5 6X, 10X     03/2022 -  Anti-estrogen oral therapy   Anastrozole     INTERVAL HISTORY:  Barbara [name] to review her survivorship care plan detailing her treatment course for breast cancer, as well as monitoring long-term side effects of that treatment, education regarding health maintenance, screening, and overall wellness and health promotion.     Overall, Barbara [name] reports feeling quite well.  She is taking anastrozole daily with good tolerance.  She has mild hot flashes which she describes as manageable.  Otherwise  she has no questions or concerns today.  REVIEW OF SYSTEMS:  Review of Systems  Constitutional:  Negative for appetite change, chills, fatigue, fever and unexpected weight change.  HENT:   Negative for hearing loss, lump/mass and trouble swallowing.   Eyes:  Negative for eye problems and icterus.  Respiratory:  Negative for chest tightness, cough and shortness of breath.   Cardiovascular:  Negative for chest pain, leg swelling and palpitations.  Gastrointestinal:  Negative for abdominal distention, abdominal pain, constipation, diarrhea, nausea and vomiting.  Endocrine: Positive for hot flashes.  Genitourinary:  Negative for difficulty urinating.   Musculoskeletal:  Negative for arthralgias.  Skin:  Negative for itching and rash.  Neurological:  Negative for dizziness, extremity weakness, headaches and numbness.  Hematological:  Negative for adenopathy. Does not bruise/bleed easily.  Psychiatric/Behavioral:  Negative for depression. The patient is not nervous/anxious.    Breast: Denies any new nodularity, masses, tenderness, nipple changes, or nipple discharge.     PAST MEDICAL/SURGICAL HISTORY:  Past Medical History:  Diagnosis Date   Hypertension    Past Surgical History:  Procedure Laterality Date   BREAST LUMPECTOMY WITH RADIOACTIVE SEED AND SENTINEL LYMPH NODE BIOPSY Right 01/21/2022   Procedure: RIGHT BREAST SEED LOCALIZED LUMPECTOMY AND SENTINEL LYMPH NODE BIOPSY;  Surgeon: Stark Klein, MD;  Location: [facility];  Service: General;  Laterality: Right;  GEN & PEC BLOCK     ALLERGIES:  No Known Allergies   CURRENT MEDICATIONS:  Outpatient Encounter Medications as of 07/14/2022  Medication Sig   anastrozole (ARIMIDEX) 1 MG tablet Take 1 tablet (1 mg total)  by mouth daily.   losartan-hydrochlorothiazide (HYZAAR) 100-12.5 MG tablet Take 1 tablet by mouth daily.   Omega-3 Fatty Acids (FISH OIL) 1000 MG CAPS Take 2 capsules by mouth daily.   No facility-administered encounter  medications on file as of 07/14/2022.     ONCOLOGIC FAMILY HISTORY:  Family History  Problem Relation Age of Onset   Multiple myeloma Mother    Lung cancer Father        d. 20   Lung cancer Maternal Uncle    Lung cancer Paternal Uncle    Leukemia Maternal Grandfather       SOCIAL HISTORY:  Social History   Socioeconomic History   Marital status: Married    Spouse name: Cheetara Hoge   Number of children: 3   Years of education: Not on file   Highest education level: Not on file  Occupational History   Not on file  Tobacco Use   Smoking status: Never   Smokeless tobacco: Never  Vaping Use   Vaping Use: Never used  Substance and Sexual Activity   Alcohol use: Never   Drug use: Never   Sexual activity: Yes  Other Topics Concern   Not on file  Social History Narrative   Not on file   Social Determinants of Health   Financial Resource Strain: Low Risk  (02/03/2022)   Overall Financial Resource Strain (CARDIA)    Difficulty of Paying Living Expenses: Not very hard  Food Insecurity: No Food Insecurity (02/03/2022)   Hunger Vital Sign    Worried About Running Out of Food in the Last Year: Never true    Ran Out of Food in the Last Year: Never true  Transportation Needs: No Transportation Needs (02/03/2022)   PRAPARE - Hydrologist (Medical): No    Lack of Transportation (Non-Medical): No  Physical Activity: Not on file  Stress: Not on file  Social Connections: Not on file  Intimate Partner Violence: Not on file     OBSERVATIONS/OBJECTIVE:  BP (!) 146/85 (BP Location: Left Arm, Patient Position: Sitting)   Pulse (!) 117   Temp 97.8 F (36.6 C) (Temporal)   Resp 18   Ht '5\' 6"'$  (1.676 m)   Wt 206 lb (93.4 kg)   SpO2 98%   BMI 33.25 kg/m  GENERAL: Patient is a well appearing female in no acute distress HEENT:  Sclerae anicteric.  Oropharynx clear and moist. No ulcerations or evidence of oropharyngeal candidiasis. Neck is  supple.  NODES:  No cervical, supraclavicular, or axillary lymphadenopathy palpated.  BREAST EXAM: Right breast status postlumpectomy and radiation no sign of local recurrence left breast is benign. LUNGS:  Clear to auscultation bilaterally.  No wheezes or rhonchi. HEART:  Regular rate and rhythm. No murmur appreciated. ABDOMEN:  Soft, nontender.  Positive, normoactive bowel sounds. No organomegaly palpated. MSK:  No focal spinal tenderness to palpation. Full range of motion bilaterally in the upper extremities. EXTREMITIES:  No peripheral edema.   SKIN:  Clear with no obvious rashes or skin changes. No nail dyscrasia. NEURO:  Nonfocal. Well oriented.  Appropriate affect.  LABORATORY DATA:  None for this visit.  DIAGNOSTIC IMAGING:  None for this visit.      ASSESSMENT AND PLAN:  Ms.. [name] is a pleasant 52 y.o. 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  She presents  to the Survivorship Clinic for our initial meeting and routine follow-up post-completion of treatment for breast cancer.    1. Stage 1B right breast cancer:  Barbara [name] is continuing to recover from definitive treatment for breast cancer. She will follow-up with her medical oncologist, Dr. Chryl Heck in April 2024 with history and physical exam per surveillance protocol.  She will continue her anti-estrogen therapy with anastrozole. Thus far, she is tolerating the anastrozole well, with minimal side effects.  Her mammogram is due June 2024; orders placed today. Today, a comprehensive survivorship care plan and treatment summary was reviewed with the patient today detailing her breast cancer diagnosis, treatment course, potential late/long-term effects of treatment, appropriate follow-up care with recommendations for the future, and patient education  resources.  A copy of this summary, along with a letter will be sent to the patient's primary care provider via mail/fax/In Basket message after today's visit.    2. Bone health:  Given Barbara [name]'s age/history of breast cancer and her current treatment regimen including anti-estrogen therapy with anastrozole, she is at risk for bone demineralization.  She is scheduled for bone density testing in April 2024 we reviewed that these should be conducted every 2 years while she is taking anastrozole therapy.  She was given education on specific activities to promote bone health.  3. Cancer screening:  Due to Barbara [name]'s history and her age, she should receive screening for skin cancers, colon cancer, and gynecologic cancers.  The information and recommendations are listed on the patient's comprehensive care plan/treatment summary and were reviewed in detail with the patient.    4. Health maintenance and wellness promotion: Barbara [name] was encouraged to consume 5-7 servings of fruits and vegetables per day. We reviewed the "Nutrition Rainbow" handout.  She was also encouraged to engage in moderate to vigorous exercise for 30 minutes per day most days of the week. We discussed the [employer] fitness program, which is designed for cancer survivors to help them become more physically fit after cancer treatments.  She was instructed to limit her alcohol consumption and continue to abstain from tobacco use.     5. Support services/counseling: It is not uncommon for this period of the patient's cancer care trajectory to be one of many emotions and stressors.   She was given information regarding our available services and encouraged to contact me with any questions or for help enrolling in any of our support group/programs.    Follow up instructions:    -Return to cancer center in April 2024 for follow-up with Dr. Chryl Heck -Mammogram due in June 2024 -Bone density testing scheduled in April 2024 -She is  welcome to return back to the Survivorship Clinic at any time; no additional follow-up needed at this time.  -Consider referral back to survivorship as a long-term survivor for continued surveillance  The patient was provided an opportunity to ask questions and all were answered. The patient agreed with the plan and demonstrated an understanding of the instructions.   Total encounter time:45 minutes*in face-to-face visit time, chart review, lab review, care coordination, order entry, and documentation of the encounter time.    Wilber Bihari, NP 07/14/22 11:45 AM Medical Oncology and Hematology [facility] [place], [place] [address] Tel. [phone]    Fax. [phone]  *Total Encounter Time as defined by the Centers for Medicare and Medicaid Services includes, in addition to the face-to-face time of a patient visit (documented in the note above) non-face-to-face time: obtaining  and reviewing outside history, ordering and reviewing medications, tests or procedures, care coordination (communications with other health care professionals or caregivers) and documentation in the medical record.

## 2022-10-03 ENCOUNTER — Ambulatory Visit
Admission: RE | Admit: 2022-10-03 | Discharge: 2022-10-03 | Disposition: A | Discharge status: Home / Self Care | Payer: 59 | Source: Ambulatory Visit | Attending: Hematology and Oncology

## 2022-10-03 DIAGNOSIS — Z170 Estrogen receptor positive status [ER+]: Secondary | ICD-10-CM

## 2022-10-14 ENCOUNTER — Inpatient Hospital Stay: Payer: 59 | Attending: Hematology and Oncology | Admitting: Hematology and Oncology

## 2022-10-14 VITALS — BP 137/81 | HR 111 | Temp 97.9°F | Resp 16 | Ht 66.0 in | Wt 202.6 lb

## 2022-10-14 DIAGNOSIS — Z923 Personal history of irradiation: Secondary | ICD-10-CM | POA: Diagnosis not present

## 2022-10-14 DIAGNOSIS — Z170 Estrogen receptor positive status [ER+]: Secondary | ICD-10-CM | POA: Diagnosis not present

## 2022-10-14 DIAGNOSIS — C50311 Malignant neoplasm of lower-inner quadrant of right female breast: Secondary | ICD-10-CM | POA: Diagnosis present

## 2022-10-14 DIAGNOSIS — Z79811 Long term (current) use of aromatase inhibitors: Secondary | ICD-10-CM | POA: Insufficient documentation

## 2022-10-14 NOTE — Progress Notes (Signed)
[facility] CONSULT NOTE  Patient Care Team: Pacifico, Camelia Eng, FNP as PCP - General (Family Medicine) Lonie Peak, MD as Attending Physician (Radiation Oncology) Rachel Moulds, MD as Consulting Physician (Hematology and Oncology) Almond Lint, MD as Consulting Physician (General Surgery)  CHIEF COMPLAINTS/PURPOSE OF CONSULTATION:  Breast cancer   HISTORY OF PRESENTING ILLNESS:  Barbara [name] 52 y.o. female is here because of recent diagnosis of right breast cancer  I reviewed her records extensively and collaborated the history with the patient.  SUMMARY OF ONCOLOGIC HISTORY: Oncology History  Breast cancer of lower-inner quadrant of right female breast  01/20/2022 Initial Diagnosis   Breast cancer of lower-inner quadrant of right female breast ([facility])   01/20/2022 Cancer Staging   Staging form: Breast, AJCC 8th Edition - Clinical stage from 01/20/2022: Stage IB (cT2, cN0, cM0, G2, ER+, PR+, HER2-) - Signed by Lonie Peak, MD on 01/20/2022 Stage prefix: Initial diagnosis Histologic grading system: 3 grade system    Definitive Surgery   Status post right breast lumpectomy and sentinel lymph node biopsy with final pathology showing invasive lobular carcinoma, grade 2 and an incidental well-differentiated tubular carcinoma, grade 1.  Lobular carcinoma measures 3.3 x 2.5 x 1.5 cm and the tubular carcinoma measures 1.2 mm in greatest dimension.  Invasive carcinoma transected and inferior margin, 0.5 mm, extensive lobular carcinoma in situ and atypical lobular hyperplasia.  Sentinel lymph nodes negative, no evidence of carcinoma.   Final pathologic staging was PT2N0.    Oncotype testing   Oncotype DX of 18, distant recurrence risk at 9 years of 5% and chemotherapy benefit less than 1%   03/05/2022 - 04/01/2022 Radiation Therapy   Site Technique Total Dose (Gy) Dose per Fx (Gy) Completed Fx Beam Energies  Breast, Right: Breast_R 3D 40.05/40.05 2.67 15/15 10X  Breast, Right:  Breast_R_Bst 3D 10/10 2 5/5 6X, 10X     03/2022 -  Anti-estrogen oral therapy   Anastrozole     Interval history  Ms. Tawni is here for follow-up on anastrozole. She only reports hot flashes day and night, but nothing intolerable. Bone density normal on 10/03/2022 Mammogram scheduled for 11/03/2022  MEDICAL HISTORY:  Past Medical History:  Diagnosis Date   Hypertension     SURGICAL HISTORY: Past Surgical History:  Procedure Laterality Date   BREAST LUMPECTOMY WITH RADIOACTIVE SEED AND SENTINEL LYMPH NODE BIOPSY Right 01/21/2022   Procedure: RIGHT BREAST SEED LOCALIZED LUMPECTOMY AND SENTINEL LYMPH NODE BIOPSY;  Surgeon: Almond Lint, MD;  Location: [facility];  Service: General;  Laterality: Right;  GEN & PEC BLOCK    SOCIAL HISTORY: Social History   Socioeconomic History   Marital status: Married    Spouse name: Lunah Losasso   Number of children: 3   Years of education: Not on file   Highest education level: Not on file  Occupational History   Not on file  Tobacco Use   Smoking status: Never   Smokeless tobacco: Never  Vaping Use   Vaping Use: Never used  Substance and Sexual Activity   Alcohol use: Never   Drug use: Never   Sexual activity: Yes  Other Topics Concern   Not on file  Social History Narrative   Not on file   Social Determinants of Health   Financial Resource Strain: Low Risk  (02/03/2022)   Overall Financial Resource Strain (CARDIA)    Difficulty of Paying Living Expenses: Not very hard  Food Insecurity: No Food Insecurity (02/03/2022)   Hunger Vital  Sign    Worried About Programme researcher, broadcasting/film/video in the Last Year: Never true    Ran Out of Food in the Last Year: Never true  Transportation Needs: No Transportation Needs (02/03/2022)   PRAPARE - Administrator, Civil Service (Medical): No    Lack of Transportation (Non-Medical): No  Physical Activity: Not on file  Stress: Not on file  Social Connections: Not on file  Intimate Partner  Violence: Not on file    FAMILY HISTORY: Family History  Problem Relation Age of Onset   Multiple myeloma Mother    Lung cancer Father        d. 62   Lung cancer Maternal Uncle    Lung cancer Paternal Uncle    Leukemia Maternal Grandfather     ALLERGIES:  has No Known Allergies.  MEDICATIONS:  Current Outpatient Medications  Medication Sig Dispense Refill   anastrozole (ARIMIDEX) 1 MG tablet Take 1 tablet (1 mg total) by mouth daily. 90 tablet 3   losartan-hydrochlorothiazide (HYZAAR) 100-12.5 MG tablet Take 1 tablet by mouth daily.     Omega-3 Fatty Acids (FISH OIL) 1000 MG CAPS Take 2 capsules by mouth daily.     simvastatin (ZOCOR) 20 MG tablet Take 20 mg by mouth at bedtime.     No current facility-administered medications for this visit.    REVIEW OF SYSTEMS:   Constitutional: Denies fevers, chills or abnormal night sweats Eyes: Denies blurriness of vision, double vision or watery eyes Ears, nose, mouth, throat, and face: Denies mucositis or sore throat Respiratory: Denies cough, dyspnea or wheezes Cardiovascular: Denies palpitation, chest discomfort or lower extremity swelling Gastrointestinal:  Denies nausea, heartburn or change in bowel habits Skin: Denies abnormal skin rashes Lymphatics: Denies new lymphadenopathy or easy bruising Neurological:Denies numbness, tingling or new weaknesses Behavioral/Psych: Mood is stable, no new changes  Breast: Denies any palpable lumps or discharge All other systems were reviewed with the patient and are negative.  PHYSICAL EXAMINATION: ECOG PERFORMANCE STATUS: 0 - Asymptomatic  Vitals:   10/14/22 1123  BP: 137/81  Pulse: (!) 111  Resp: 16  Temp: 97.9 F (36.6 C)  SpO2: 98%    Filed Weights   10/14/22 1123  Weight: 202 lb 9.6 oz (91.9 kg)     GENERAL:alert, no distress and comfortable Breast: Right breast surgical scar and axillary scar appears to be healing well  LABORATORY DATA:  I have reviewed the data as  listed Lab Results  Component Value Date   WBC 8.6 01/17/2022   HGB 15.3 (H) 01/17/2022   HCT 44.8 01/17/2022   MCV 88.7 01/17/2022   PLT 330 01/17/2022   Lab Results  Component Value Date   NA 140 01/17/2022   K 3.8 01/17/2022   CL 106 01/17/2022   CO2 26 01/17/2022    RADIOGRAPHIC STUDIES: I have personally reviewed the radiological reports and agreed with the findings in the report.  ASSESSMENT AND PLAN:  No problem-specific Assessment & Plan notes found for this encounter.    Total time spent 20 minutes including history, physical exam, review of records, counseling and coordination of care All questions were answered. The patient knows to call the clinic with any problems, questions or concerns.    Rachel Moulds, MD 10/14/22

## 2022-10-14 NOTE — Progress Notes (Signed)
[facility] CONSULT NOTE  Patient Care Team: Pacifico, Camelia Eng, FNP as PCP - General (Family Medicine) Lonie Peak, MD as Attending Physician (Radiation Oncology) Rachel Moulds, MD as Consulting Physician (Hematology and Oncology) Almond Lint, MD as Consulting Physician (General Surgery)  CHIEF COMPLAINTS/PURPOSE OF CONSULTATION:    HISTORY OF PRESENTING ILLNESS:  Barbara [name] 52 y.o. female is here because of recent diagnosis of right breast cancer  I reviewed her records extensively and collaborated the history with the patient.  SUMMARY OF ONCOLOGIC HISTORY: Oncology History  Breast cancer of lower-inner quadrant of right female breast  01/20/2022 Initial Diagnosis   Breast cancer of lower-inner quadrant of right female breast (HCC)   01/20/2022 Cancer Staging   Staging form: Breast, AJCC 8th Edition - Clinical stage from 01/20/2022: Stage IB (cT2, cN0, cM0, G2, ER+, PR+, HER2-) - Signed by Lonie Peak, MD on 01/20/2022 Stage prefix: Initial diagnosis Histologic grading system: 3 grade system    Definitive Surgery   Status post right breast lumpectomy and sentinel lymph node biopsy with final pathology showing invasive lobular carcinoma, grade 2 and an incidental well-differentiated tubular carcinoma, grade 1.  Lobular carcinoma measures 3.3 x 2.5 x 1.5 cm and the tubular carcinoma measures 1.2 mm in greatest dimension.  Invasive carcinoma transected and inferior margin, 0.5 mm, extensive lobular carcinoma in situ and atypical lobular hyperplasia.  Sentinel lymph nodes negative, no evidence of carcinoma.   Final pathologic staging was PT2N0.    Oncotype testing   Oncotype DX of 18, distant recurrence risk at 9 years of 5% and chemotherapy benefit less than 1%   03/05/2022 - 04/01/2022 Radiation Therapy   Site Technique Total Dose (Gy) Dose per Fx (Gy) Completed Fx Beam Energies  Breast, Right: Breast_R 3D 40.05/40.05 2.67 15/15 10X  Breast, Right: Breast_R_Bst 3D  10/10 2 5/5 6X, 10X     03/2022 -  Anti-estrogen oral therapy   Anastrozole     Interval history  Ms. Barbara [name] is here for follow-up of on anastrozole.  She is tolerating it extremely well except for hot flashes which are overall tolerable.  She has lost about 7 or 8 pounds with dietary modification and regular exercise.  Baseline bone density is normal.  She is already scheduled for mammogram.  Rest of the pertinent 10 point ROS reviewed and negative  MEDICAL HISTORY:  Past Medical History:  Diagnosis Date   Hypertension     SURGICAL HISTORY: Past Surgical History:  Procedure Laterality Date   BREAST LUMPECTOMY WITH RADIOACTIVE SEED AND SENTINEL LYMPH NODE BIOPSY Right 01/21/2022   Procedure: RIGHT BREAST SEED LOCALIZED LUMPECTOMY AND SENTINEL LYMPH NODE BIOPSY;  Surgeon: Almond Lint, MD;  Location: [facility] OR;  Service: General;  Laterality: Right;  GEN & PEC BLOCK    SOCIAL HISTORY: Social History   Socioeconomic History   Marital status: Married    Spouse name: Timica Marcom   Number of children: 3   Years of education: Not on file   Highest education level: Not on file  Occupational History   Not on file  Tobacco Use   Smoking status: Never   Smokeless tobacco: Never  Vaping Use   Vaping Use: Never used  Substance and Sexual Activity   Alcohol use: Never   Drug use: Never   Sexual activity: Yes  Other Topics Concern   Not on file  Social History Narrative   Not on file   Social Determinants of Health   Financial Resource Strain:  Low Risk  (02/03/2022)   Overall Financial Resource Strain (CARDIA)    Difficulty of Paying Living Expenses: Not very hard  Food Insecurity: No Food Insecurity (02/03/2022)   Hunger Vital Sign    Worried About Running Out of Food in the Last Year: Never true    Ran Out of Food in the Last Year: Never true  Transportation Needs: No Transportation Needs (02/03/2022)   PRAPARE - Administrator, Civil Service (Medical): No     Lack of Transportation (Non-Medical): No  Physical Activity: Not on file  Stress: Not on file  Social Connections: Not on file  Intimate Partner Violence: Not on file    FAMILY HISTORY: Family History  Problem Relation Age of Onset   Multiple myeloma Mother    Lung cancer Father        d. 90   Lung cancer Maternal Uncle    Lung cancer Paternal Uncle    Leukemia Maternal Grandfather     ALLERGIES:  has No Known Allergies.  MEDICATIONS:  Current Outpatient Medications  Medication Sig Dispense Refill   anastrozole (ARIMIDEX) 1 MG tablet Take 1 tablet (1 mg total) by mouth daily. 90 tablet 3   losartan-hydrochlorothiazide (HYZAAR) 100-12.5 MG tablet Take 1 tablet by mouth daily.     Omega-3 Fatty Acids (FISH OIL) 1000 MG CAPS Take 2 capsules by mouth daily.     simvastatin (ZOCOR) 20 MG tablet Take 20 mg by mouth at bedtime.     No current facility-administered medications for this visit.    REVIEW OF SYSTEMS:   Constitutional: Denies fevers, chills or abnormal night sweats Eyes: Denies blurriness of vision, double vision or watery eyes Ears, nose, mouth, throat, and face: Denies mucositis or sore throat Respiratory: Denies cough, dyspnea or wheezes Cardiovascular: Denies palpitation, chest discomfort or lower extremity swelling Gastrointestinal:  Denies nausea, heartburn or change in bowel habits Skin: Denies abnormal skin rashes Lymphatics: Denies new lymphadenopathy or easy bruising Neurological:Denies numbness, tingling or new weaknesses Behavioral/Psych: Mood is stable, no new changes  Breast: Denies any palpable lumps or discharge All other systems were reviewed with the patient and are negative.  PHYSICAL EXAMINATION: ECOG PERFORMANCE STATUS: 0 - Asymptomatic  Vitals:   10/14/22 1123  BP: 137/81  Pulse: (!) 111  Resp: 16  Temp: 97.9 F (36.6 C)  SpO2: 98%    Filed Weights   10/14/22 1123  Weight: 202 lb 9.6 oz (91.9 kg)     GENERAL:alert, no  distress and comfortable Breast: Right breast postsurgical and postradiation changes.  No other palpable concerns.  Left breast normal to inspection and palpation.  No regional adenopathy.  LABORATORY DATA:  I have reviewed the data as listed Lab Results  Component Value Date   WBC 8.6 01/17/2022   HGB 15.3 (H) 01/17/2022   HCT 44.8 01/17/2022   MCV 88.7 01/17/2022   PLT 330 01/17/2022   Lab Results  Component Value Date   NA 140 01/17/2022   K 3.8 01/17/2022   CL 106 01/17/2022   CO2 26 01/17/2022    RADIOGRAPHIC STUDIES: I have personally reviewed the radiological reports and agreed with the findings in the report.  ASSESSMENT AND PLAN:  Breast cancer of lower-inner quadrant of right female breast [facility]) This is a very pleasant 52 year old female patient with newly diagnosed right breast invasive lobular carcinoma, ER/PR strongly +90%, HER2 negative Ki-67 of 2%.  She is status post right breast lumpectomy and sentinel lymph node biopsy with  final pathology confirming invasive lobular carcinoma grade 2 as well as an incidental well-differentiated tubular carcinoma, grade 1.  Lobular carcinoma measured 3.3 x 2.5 x 1.5 cm and the tubular carcinoma measured 1.2 mm in greatest extent.  Sentinel lymph nodes negative.  She is here to discuss adjuvant recommendations.    Oncotype Dx of 18, no benefit of chemotherapy.  She completed adjuvant radiation and is now on anastrozole.  Her last menstrual cycle was more than 2 years ago.  She is tolerating anastrozole extremely well except for mild hot flashes.  She is regularly exercising, has lost about 8 to 10 pounds since her last visit here and is very thrilled by it.  At baseline bone density normal.  Mammogram scheduled for May.  On exam today postop and postradiation changes noted, no other major concerns.  She will return to clinic in April of next year.  I encouraged her to follow-up with Dr. Donell Beers in fall of this year and she was strongly  encouraged to call us with any new questions or as needed.    Total time spent 20 minutes including history, physical exam, review of records, counseling and coordination of care All questions were answered. The patient knows to call the clinic with any problems, questions or concerns.    Rachel Moulds, MD 10/14/22

## 2022-10-14 NOTE — Assessment & Plan Note (Signed)
This is a very pleasant 52 year old female patient with newly diagnosed right breast invasive lobular carcinoma, ER/PR strongly +90%, HER2 negative Ki-67 of 2%.  She is status post right breast lumpectomy and sentinel lymph node biopsy with final pathology confirming invasive lobular carcinoma grade 2 as well as an incidental well-differentiated tubular carcinoma, grade 1.  Lobular carcinoma measured 3.3 x 2.5 x 1.5 cm and the tubular carcinoma measured 1.2 mm in greatest extent.  Sentinel lymph nodes negative.  She is here to discuss adjuvant recommendations.    Oncotype Dx of 18, no benefit of chemotherapy.  She completed adjuvant radiation and is now on anastrozole.  Her last menstrual cycle was more than 2 years ago.  She is tolerating anastrozole extremely well except for mild hot flashes.  She is regularly exercising, has lost about 8 to 10 pounds since her last visit here and is very thrilled by it.  At baseline bone density normal.  Mammogram scheduled for May.  On exam today postop and postradiation changes noted, no other major concerns.  She will return to clinic in April of next year.  I encouraged her to follow-up with Dr. Donell Beers in fall of this year and she was strongly encouraged to call us with any new questions or as needed.

## 2022-11-03 ENCOUNTER — Ambulatory Visit
Admission: RE | Admit: 2022-11-03 | Discharge: 2022-11-03 | Disposition: A | Discharge status: Home / Self Care | Payer: 59 | Source: Ambulatory Visit | Attending: Adult Health | Admitting: Adult Health

## 2022-11-03 DIAGNOSIS — C50311 Malignant neoplasm of lower-inner quadrant of right female breast: Secondary | ICD-10-CM

## 2023-02-11 ENCOUNTER — Telehealth: Payer: Self-pay | Admitting: Hematology and Oncology

## 2023-02-11 NOTE — Telephone Encounter (Signed)
Patient has currently declined on scheduling appointment for [facility], patient is aware they can call back at anytime to reschedule appointment when ready

## 2023-02-26 ENCOUNTER — Telehealth: Payer: Self-pay | Admitting: *Deleted

## 2023-02-26 NOTE — Telephone Encounter (Addendum)
VM received from [facility] with [facility] asking for CPT code for genetic testing - requested that we contact the patient with the information due to " I have so many incoming and outgoing calls I am afraid we would just play telephone tag".  This RN noted referral for genetics but pt declining appt at this time.  This RN called pt per above- she stated she is trying to verify what her out of pocket cost would be with pursuing genetic testing.  This note will be forwarded to our Genetic team for possible follow up with pt per her inquiry.

## 2023-03-24 ENCOUNTER — Other Ambulatory Visit: Payer: Self-pay | Admitting: Hematology and Oncology

## 2023-04-03 ENCOUNTER — Other Ambulatory Visit: Payer: Self-pay | Admitting: Hematology and Oncology

## 2023-07-05 IMAGING — MR MR BREAST BILAT WO/W CM
8 of 12 series · 30 of 48 positions shown · IV contrast (gadavist)
Comparison: Outside screening mammogram dated 10/30/2021, outside
RIGHT breast ultrasound dated 10/31/2021, and outside
ultrasound-guided biopsy dated 11/05/2021.

CLINICAL DATA: Recent diagnosis of RIGHT breast cancer (biopsy on
11/05/2021 at an outside facility).

EXAM:
BILATERAL BREAST MRI WITH AND WITHOUT CONTRAST
TECHNIQUE: Multiplanar, multisequence MR images of both breasts were obtained
prior to and following the intravenous administration of 9 ml of
Gadavist

[Series 2: t2_tirm_tra ipat (a-p) · axial · 3.0mm · 0.78mm/px · 1 of 55 slices shown]
[im 1/55]
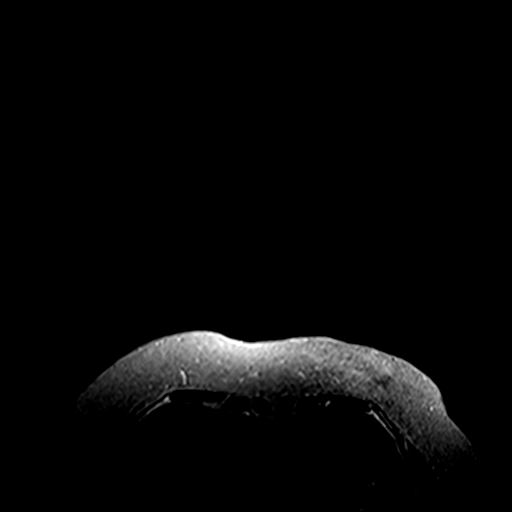

[Series 3: fl3d pre-cm no · axial · non-contrast · 1.2mm · 1.04mm/px · z∈[-69,+83]mm · 5 of 128 slices shown]
[im 1/128]
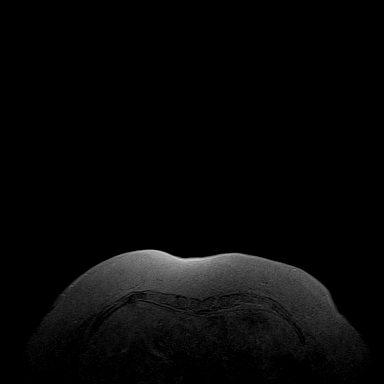
[im 32/128]
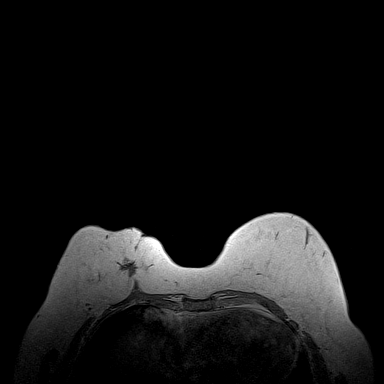
[im 64/128]
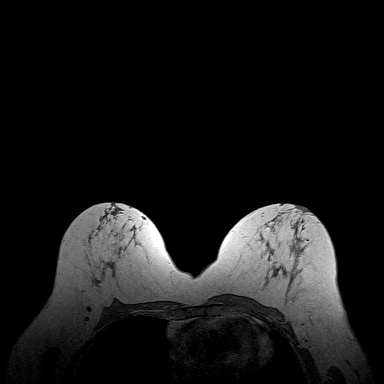
[im 96/128]
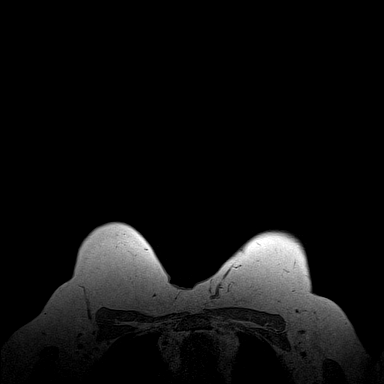
[im 128/128]
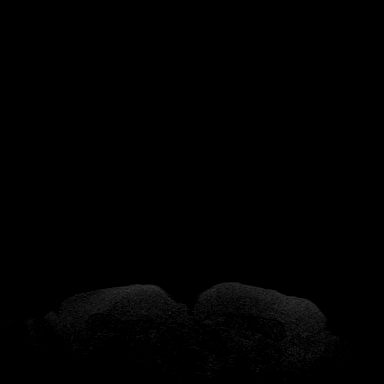

[Series 4: fl3d pre-cm · axial · non-contrast · 1.2mm · 1.04mm/px · z∈[-69,+83]mm · 5 of 128 slices shown]
[im 1/128]
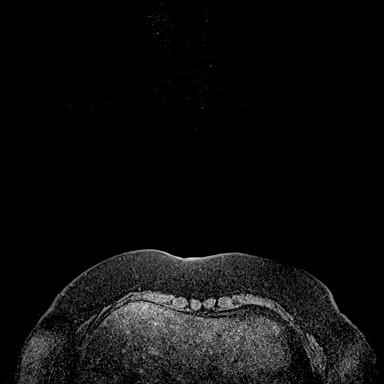
[im 32/128]
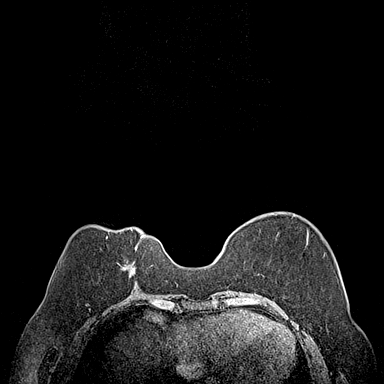
[im 64/128]
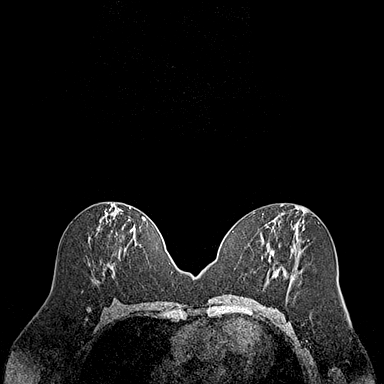
[im 96/128]
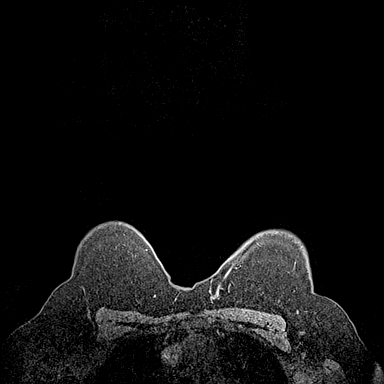
[im 128/128]
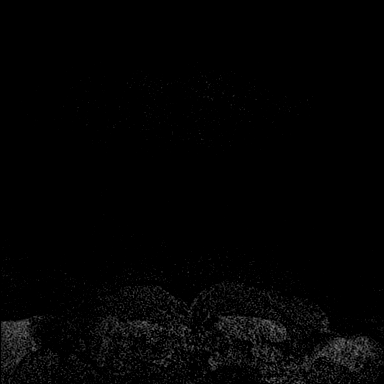

[Series 5: fl3d post-cm 20 · axial · 1.2mm · 1.04mm/px · z∈[-69,+83]mm · 5 of 128 slices shown (1 of 3)]
[im 1/128]
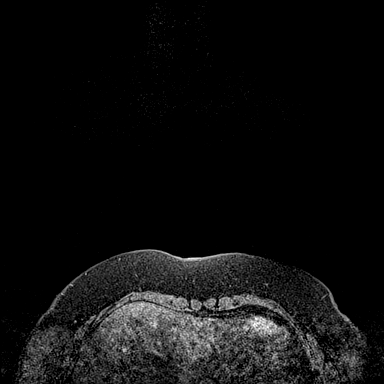
[im 32/128]
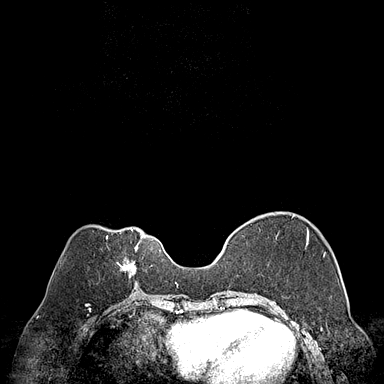
[im 64/128]
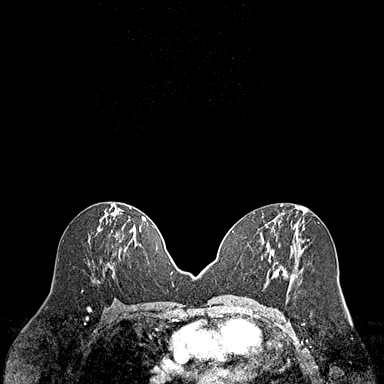
[im 96/128]
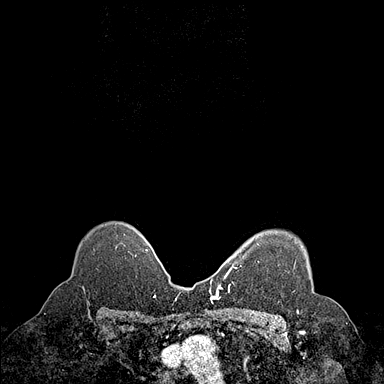
[im 128/128]
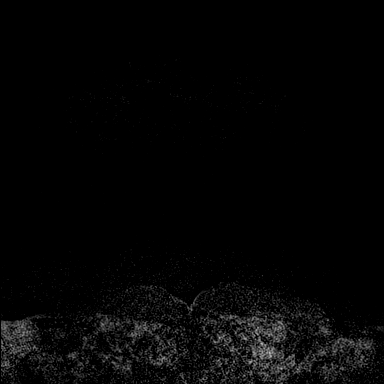

[Series 6: fl3d post-cm 20 · axial · 1.2mm · 1.04mm/px · z∈[-69,+83]mm · 5 of 128 slices shown (2 of 3)]
[im 1/128]
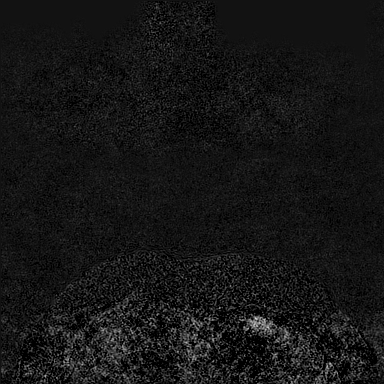
[im 32/128]
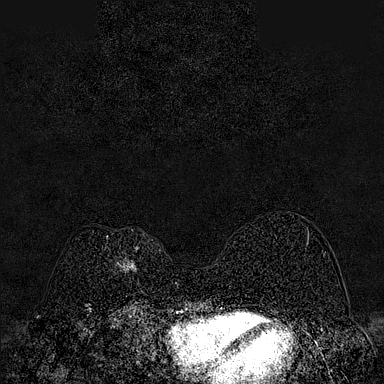
[im 64/128]
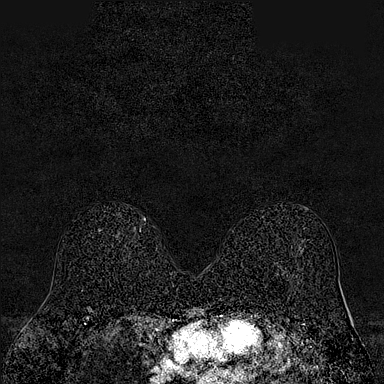
[im 96/128]
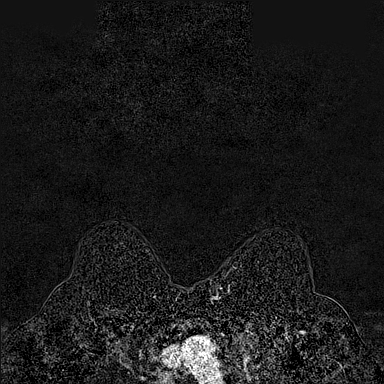
[im 128/128]
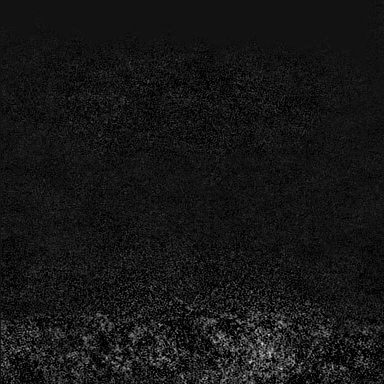

[Series 7: fl3d post-cm 20 · axial · 153.6mm · 1.04mm/px · 1 of 1 slices shown (3 of 3)]
[im 1/1]
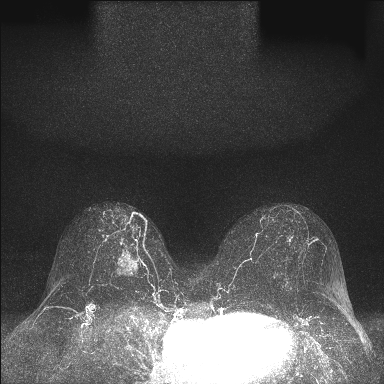

[Series 8: fl3d post-cm 3 · axial · 1.2mm · 1.04mm/px · z∈[-69,+83]mm · 6 of 128 slices shown (1 of 2)]
[im 1/128]
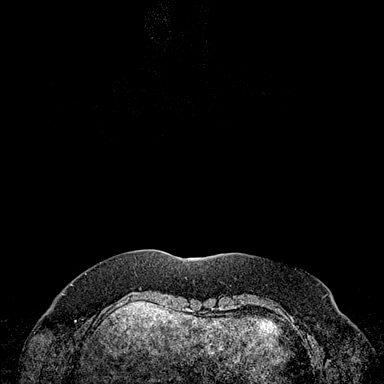
[im 26/128]
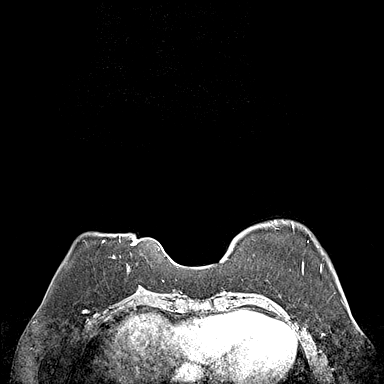
[im 51/128]
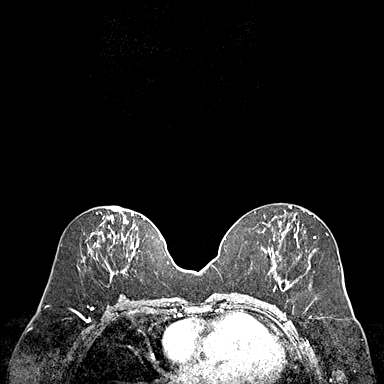
[im 77/128]
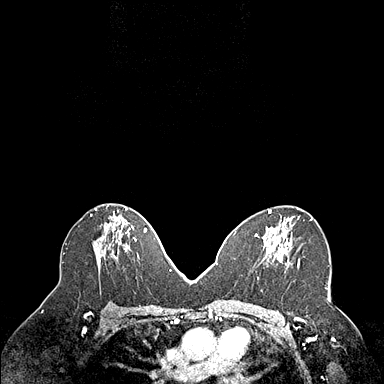
[im 102/128]
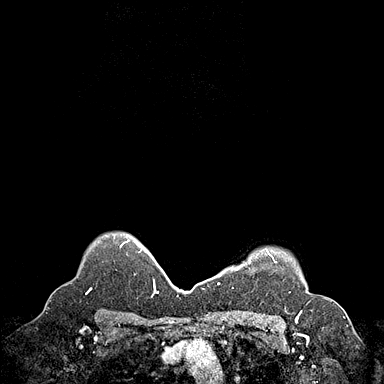
[im 128/128]
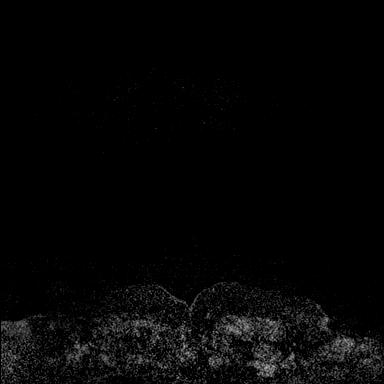

[Series 9: fl3d post-cm 3 · axial · 1.2mm · 1.04mm/px · z∈[-69,-39]mm · 2 of 128 slices shown (2 of 2)]
[im 1/128]
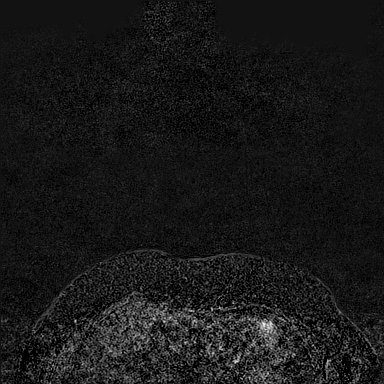
[im 26/128]
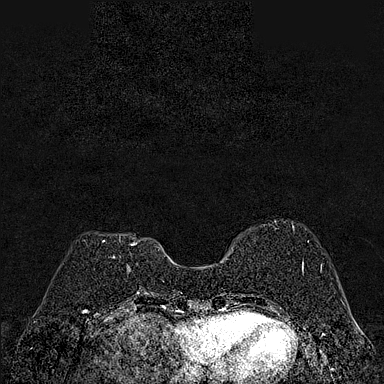

[30 of 48 positions shown; findings below may reference images not displayed]

Three-dimensional MR images were rendered by post-processing of the
original MR data on an independent workstation. The
three-dimensional MR images were interpreted, and findings are
reported in the following complete MRI report for this study. Three
dimensional images were evaluated at the independent interpreting
workstation using the DynaCAD thin client.
FINDINGS: Breast composition: c. Heterogeneous fibroglandular tissue.

Background parenchymal enhancement: Mild to moderate.

Right breast: Spiculated enhancing mass within the lower RIGHT
breast, labeled 5:30 o'clock axis on outside ultrasound, measuring
2.6 x 2.2 cm, with associated biopsy clip, corresponding to
patient's history of biopsy-proven carcinoma. There is tenting of
the underlying pectoralis muscle, but no evidence of
enhancement/extension into the pectoralis muscle.

There is linear contiguous non-mass enhancement which extends from
the mass to near the base of the RIGHT nipple, with predominantly
persistent enhancement kinetics, measuring an additional 3.1 cm
extent, increasing the overall AP measurement to 5.3 cm, with
associated retraction of the RIGHT nipple and lower anterior
periareolar RIGHT breast.

There is no additional suspicious enhancing mass, suspicious
non-mass enhancement or secondary signs of malignancy elsewhere
within the RIGHT breast.

Left breast: No suspicious enhancing mass, suspicious non-mass
enhancement or secondary signs of malignancy.

Lymph nodes: No abnormal appearing lymph nodes.

Ancillary findings:  None.
IMPRESSION: 1. Biopsy-proven carcinoma within the lower RIGHT breast, labeled
5:30 o'clock axis on outside ultrasound, measuring 2.6 cm greatest
dimension. Associated tenting of the underlying pectoralis muscle,
but no evidence of enhancement/extension into the underlying
pectoralis muscle.
2. Linear contiguous non-mass enhancement extending from the
biopsy-proven carcinoma to near the base of the RIGHT nipple,
measuring an additional 3.1 cm extent, increasing the overall AP
measurement to 5.3 cm, with associated retraction of the RIGHT
nipple and lower anterior periareolar RIGHT breast. Can not exclude
involvement of the RIGHT nipple itself. If breast conservation
surgery, recommend adequate excision from the mass to the RIGHT
nipple.
3. No evidence of additional multifocal or multicentric disease
within the RIGHT breast.
4. No evidence of contralateral malignancy within the LEFT breast.
5. No evidence of metastatic lymphadenopathy.

RECOMMENDATION:
1. If breast conservation surgery for the RIGHT breast, recommend
adequate excision from the biopsy-proven carcinoma in the lower
RIGHT breast to the RIGHT nipple (as detailed above).
2. Otherwise, per treatment plan for patient's biopsy-proven RIGHT
breast cancer.

BI-RADS CATEGORY  6: Known biopsy-proven malignancy.

## 2023-09-19 ENCOUNTER — Other Ambulatory Visit: Payer: Self-pay | Admitting: Hematology and Oncology

## 2023-09-21 ENCOUNTER — Other Ambulatory Visit: Payer: Self-pay | Admitting: General Surgery

## 2023-09-21 DIAGNOSIS — Z09 Encounter for follow-up examination after completed treatment for conditions other than malignant neoplasm: Secondary | ICD-10-CM

## 2023-10-14 ENCOUNTER — Telehealth: Payer: Self-pay

## 2023-10-14 NOTE — Telephone Encounter (Signed)
 Spoke with patient and confirmed appointment on 4/17

## 2023-10-15 ENCOUNTER — Inpatient Hospital Stay: Payer: 59 | Attending: Hematology and Oncology | Admitting: Hematology and Oncology

## 2023-10-15 VITALS — BP 133/74 | HR 102 | Temp 98.5°F | Resp 16 | Wt 208.6 lb

## 2023-10-15 DIAGNOSIS — Z801 Family history of malignant neoplasm of trachea, bronchus and lung: Secondary | ICD-10-CM | POA: Insufficient documentation

## 2023-10-15 DIAGNOSIS — Z807 Family history of other malignant neoplasms of lymphoid, hematopoietic and related tissues: Secondary | ICD-10-CM | POA: Diagnosis not present

## 2023-10-15 DIAGNOSIS — Z806 Family history of leukemia: Secondary | ICD-10-CM | POA: Diagnosis not present

## 2023-10-15 DIAGNOSIS — Z780 Asymptomatic menopausal state: Secondary | ICD-10-CM | POA: Insufficient documentation

## 2023-10-15 DIAGNOSIS — Z923 Personal history of irradiation: Secondary | ICD-10-CM | POA: Diagnosis not present

## 2023-10-15 DIAGNOSIS — Z79811 Long term (current) use of aromatase inhibitors: Secondary | ICD-10-CM | POA: Insufficient documentation

## 2023-10-15 DIAGNOSIS — Z1721 Progesterone receptor positive status: Secondary | ICD-10-CM | POA: Diagnosis not present

## 2023-10-15 DIAGNOSIS — Z170 Estrogen receptor positive status [ER+]: Secondary | ICD-10-CM | POA: Insufficient documentation

## 2023-10-15 DIAGNOSIS — Z1732 Human epidermal growth factor receptor 2 negative status: Secondary | ICD-10-CM | POA: Insufficient documentation

## 2023-10-15 DIAGNOSIS — C50311 Malignant neoplasm of lower-inner quadrant of right female breast: Secondary | ICD-10-CM | POA: Insufficient documentation

## 2023-10-15 NOTE — Assessment & Plan Note (Addendum)
 This is a very pleasant 53 year old female patient with newly diagnosed right breast invasive lobular carcinoma, ER/PR strongly +90%, HER2 negative Ki-67 of 2%.  She is status post right breast lumpectomy and sentinel lymph node biopsy with final pathology confirming invasive lobular carcinoma grade 2 as well as an incidental well-differentiated tubular carcinoma, grade 1.  Lobular carcinoma measured 3.3 x 2.5 x 1.5 cm and the tubular carcinoma measured 1.2 mm in greatest extent.  Sentinel lymph nodes negative.   Assessment and Plan Assessment & Plan Breast Cancer Postmenopausal on anastrozole for 1.5 years post-lumpectomy and radiation. Night sweats improved.  - Continue anastrozole therapy. - Encourage walking 30 minutes daily. - Ensure adequate vitamin D intake. - Schedule bone density scan in 2026. - Last bone density scan, normal bone density.  Follow-up Mammogram scheduled next month. Primary care visit in May. Oncologist follow-up next year. - Schedule mammogram next month. - Schedule follow-up with oncologist next year.

## 2023-10-15 NOTE — Progress Notes (Signed)
 [facility] CONSULT NOTE  Patient Care Team: Pacifico, Camelia Eng, FNP as PCP - General (Family Medicine) Lonie Peak, MD as Attending Physician (Radiation Oncology) Rachel Moulds, MD as Consulting Physician (Hematology and Oncology) Almond Lint, MD as Consulting Physician (General Surgery)  CHIEF COMPLAINTS/PURPOSE OF CONSULTATION:    HISTORY OF PRESENTING ILLNESS:  Barbara [name] 53 y.o. female is here because of recent diagnosis of right breast cancer  I reviewed her records extensively and collaborated the history with the patient.  SUMMARY OF ONCOLOGIC HISTORY: Oncology History  Breast cancer of lower-inner quadrant of right female breast (HCC)  01/20/2022 Initial Diagnosis   Breast cancer of lower-inner quadrant of right female breast (HCC)   01/20/2022 Cancer Staging   Staging form: Breast, AJCC 8th Edition - Clinical stage from 01/20/2022: Stage IB (cT2, cN0, cM0, G2, ER+, PR+, HER2-) - Signed by Lonie Peak, MD on 01/20/2022 Stage prefix: Initial diagnosis Histologic grading system: 3 grade system    Definitive Surgery   Status post right breast lumpectomy and sentinel lymph node biopsy with final pathology showing invasive lobular carcinoma, grade 2 and an incidental well-differentiated tubular carcinoma, grade 1.  Lobular carcinoma measures 3.3 x 2.5 x 1.5 cm and the tubular carcinoma measures 1.2 mm in greatest dimension.  Invasive carcinoma transected and inferior margin, 0.5 mm, extensive lobular carcinoma in situ and atypical lobular hyperplasia.  Sentinel lymph nodes negative, no evidence of carcinoma.   Final pathologic staging was PT2N0.    Oncotype testing   Oncotype DX of 18, distant recurrence risk at 9 years of 5% and chemotherapy benefit less than 1%   03/05/2022 - 04/01/2022 Radiation Therapy   Site Technique Total Dose (Gy) Dose per Fx (Gy) Completed Fx Beam Energies  Breast, Right: Breast_R 3D 40.05/40.05 2.67 15/15 10X  Breast, Right:  Breast_R_Bst 3D 10/10 2 5/5 6X, 10X     03/2022 -  Anti-estrogen oral therapy   Anastrozole    Discussed the use of AI scribe software for clinical note transcription with the patient, who gave verbal consent to proceed.  History of Present Illness Barbara [name] is a 53 year old female with breast cancer who presents for follow-up on anastrozole therapy.  She has been on anastrozole for about a year and a half. Night sweats are improving but still present. She is scheduled for a mammogram next month and has a primary care physician appointment in May. Her recent Pap smear was normal  She engages in physical activity by watching her one-year-old grandson, who is crawling and walking, and she plans to incorporate more walking into her routine as the weather warms up. She takes a multivitamin that includes vitamin D.   No new health issues or pain in her legs. No changes in her breasts aside from radiation effects.   MEDICAL HISTORY:  Past Medical History:  Diagnosis Date   Hypertension     SURGICAL HISTORY: Past Surgical History:  Procedure Laterality Date   BREAST LUMPECTOMY WITH RADIOACTIVE SEED AND SENTINEL LYMPH NODE BIOPSY Right 01/21/2022   Procedure: RIGHT BREAST SEED LOCALIZED LUMPECTOMY AND SENTINEL LYMPH NODE BIOPSY;  Surgeon: Almond Lint, MD;  Location: [facility] OR;  Service: General;  Laterality: Right;  GEN & PEC BLOCK    SOCIAL HISTORY: Social History   Socioeconomic History   Marital status: Married    Spouse name: Tiajuana Leppanen   Number of children: 3   Years of education: Not on file   Highest education level: Not on file  Occupational History   Not on file  Tobacco Use   Smoking status: Never   Smokeless tobacco: Never  Vaping Use   Vaping status: Never Used  Substance and Sexual Activity   Alcohol use: Never   Drug use: Never   Sexual activity: Yes  Other Topics Concern   Not on file  Social History Narrative   Not on file   Social Drivers  of Health   Financial Resource Strain: Low Risk  (02/03/2022)   Overall Financial Resource Strain (CARDIA)    Difficulty of Paying Living Expenses: Not very hard  Food Insecurity: No Food Insecurity (02/03/2022)   Hunger Vital Sign    Worried About Running Out of Food in the Last Year: Never true    Ran Out of Food in the Last Year: Never true  Transportation Needs: No Transportation Needs (02/03/2022)   PRAPARE - Administrator, Civil Service (Medical): No    Lack of Transportation (Non-Medical): No  Physical Activity: Not on file  Stress: Not on file  Social Connections: Not on file  Intimate Partner Violence: Not on file    FAMILY HISTORY: Family History  Problem Relation Age of Onset   Multiple myeloma Mother    Lung cancer Father        d. 75   Lung cancer Maternal Uncle    Lung cancer Paternal Uncle    Leukemia Maternal Grandfather     ALLERGIES:  has no known allergies.  MEDICATIONS:  Current Outpatient Medications  Medication Sig Dispense Refill   anastrozole (ARIMIDEX) 1 MG tablet Take 1 tablet by mouth once daily 90 tablet 0   losartan-hydrochlorothiazide (HYZAAR) 100-12.5 MG tablet Take 1 tablet by mouth daily.     Omega-3 Fatty Acids (FISH OIL) 1000 MG CAPS Take 2 capsules by mouth daily.     simvastatin (ZOCOR) 20 MG tablet Take 20 mg by mouth at bedtime.     No current facility-administered medications for this visit.    REVIEW OF SYSTEMS:   Constitutional: Denies fevers, chills or abnormal night sweats Eyes: Denies blurriness of vision, double vision or watery eyes Ears, nose, mouth, throat, and face: Denies mucositis or sore throat Respiratory: Denies cough, dyspnea or wheezes Cardiovascular: Denies palpitation, chest discomfort or lower extremity swelling Gastrointestinal:  Denies nausea, heartburn or change in bowel habits Skin: Denies abnormal skin rashes Lymphatics: Denies new lymphadenopathy or easy bruising Neurological:Denies numbness,  tingling or new weaknesses Behavioral/Psych: Mood is stable, no new changes  Breast: Denies any palpable lumps or discharge All other systems were reviewed with the patient and are negative.  PHYSICAL EXAMINATION: ECOG PERFORMANCE STATUS: 0 - Asymptomatic  Vitals:   10/15/23 0928  BP: 133/74  Pulse: (!) 102  Resp: 16  Temp: 98.5 F (36.9 C)  SpO2: 99%    Filed Weights   10/15/23 0928  Weight: 208 lb 9.6 oz (94.6 kg)   GENERAL:alert, no distress and comfortable Breast: Right breast postsurgical and postradiation changes.  No other palpable concerns.  Left breast normal to inspection and palpation.  No regional adenopathy.  LABORATORY DATA:  I have reviewed the data as listed Lab Results  Component Value Date   WBC 8.6 01/17/2022   HGB 15.3 (H) 01/17/2022   HCT 44.8 01/17/2022   MCV 88.7 01/17/2022   PLT 330 01/17/2022   Lab Results  Component Value Date   NA 140 01/17/2022   K 3.8 01/17/2022   CL 106 01/17/2022   CO2 26  01/17/2022    RADIOGRAPHIC STUDIES: I have personally reviewed the radiological reports and agreed with the findings in the report.  ASSESSMENT AND PLAN:  Breast cancer of lower-inner quadrant of right female breast [facility]) This is a very pleasant 53 year old female patient with newly diagnosed right breast invasive lobular carcinoma, ER/PR strongly +90%, HER2 negative Ki-67 of 2%.  She is status post right breast lumpectomy and sentinel lymph node biopsy with final pathology confirming invasive lobular carcinoma grade 2 as well as an incidental well-differentiated tubular carcinoma, grade 1.  Lobular carcinoma measured 3.3 x 2.5 x 1.5 cm and the tubular carcinoma measured 1.2 mm in greatest extent.  Sentinel lymph nodes negative.   Assessment and Plan Assessment & Plan Breast Cancer Postmenopausal on anastrozole for 1.5 years post-lumpectomy and radiation. Night sweats improved.  - Continue anastrozole therapy. - Encourage walking 30 minutes  daily. - Ensure adequate vitamin D intake. - Schedule bone density scan in 2026. - Last bone density scan, normal bone density.  Follow-up Mammogram scheduled next month. Primary care visit in May. Oncologist follow-up next year. - Schedule mammogram next month. - Schedule follow-up with oncologist next year.    Total time spent 20 minutes including history, physical exam, review of records, counseling and coordination of care All questions were answered. The patient knows to call the clinic with any problems, questions or concerns.    Murleen Arms, MD 10/15/23

## 2023-10-29 ENCOUNTER — Other Ambulatory Visit: Payer: Self-pay | Admitting: General Surgery

## 2023-10-29 DIAGNOSIS — Z9889 Other specified postprocedural states: Secondary | ICD-10-CM

## 2023-11-04 ENCOUNTER — Ambulatory Visit
Admission: RE | Admit: 2023-11-04 | Discharge: 2023-11-04 | Disposition: A | Discharge status: Home / Self Care | Source: Ambulatory Visit | Attending: General Surgery | Admitting: General Surgery

## 2023-11-04 DIAGNOSIS — Z9889 Other specified postprocedural states: Secondary | ICD-10-CM

## 2023-11-04 HISTORY — DX: Personal history of irradiation: Z92.3

## 2023-12-15 ENCOUNTER — Other Ambulatory Visit: Payer: Self-pay | Admitting: Hematology and Oncology

## 2024-03-10 ENCOUNTER — Other Ambulatory Visit: Payer: Self-pay | Admitting: Hematology and Oncology

## 2024-03-10 NOTE — Telephone Encounter (Signed)
 Per last office note refilling

## 2024-06-03 ENCOUNTER — Other Ambulatory Visit: Payer: Self-pay | Admitting: Hematology and Oncology

## 2024-10-14 ENCOUNTER — Ambulatory Visit: Admitting: Hematology and Oncology
